# Patient Record
Sex: Female | Born: 1980
Health system: Southern US, Community
[De-identification: ages and names within clinical notes are randomized; demographics above are authoritative.]

## PROBLEM LIST (undated history)

## (undated) DIAGNOSIS — F419 Anxiety disorder, unspecified: Secondary | ICD-10-CM

## (undated) DIAGNOSIS — E063 Autoimmune thyroiditis: Secondary | ICD-10-CM

## (undated) DIAGNOSIS — F319 Bipolar disorder, unspecified: Secondary | ICD-10-CM

## (undated) DIAGNOSIS — K760 Fatty (change of) liver, not elsewhere classified: Secondary | ICD-10-CM

## (undated) DIAGNOSIS — F32A Depression, unspecified: Secondary | ICD-10-CM

## (undated) DIAGNOSIS — E739 Lactose intolerance, unspecified: Secondary | ICD-10-CM

## (undated) DIAGNOSIS — E538 Deficiency of other specified B group vitamins: Secondary | ICD-10-CM

## (undated) DIAGNOSIS — E282 Polycystic ovarian syndrome: Secondary | ICD-10-CM

## (undated) DIAGNOSIS — E663 Overweight: Secondary | ICD-10-CM

## (undated) DIAGNOSIS — F329 Major depressive disorder, single episode, unspecified: Secondary | ICD-10-CM

## (undated) DIAGNOSIS — J45909 Unspecified asthma, uncomplicated: Secondary | ICD-10-CM

## (undated) DIAGNOSIS — M549 Dorsalgia, unspecified: Secondary | ICD-10-CM

## (undated) DIAGNOSIS — K5900 Constipation, unspecified: Secondary | ICD-10-CM

## (undated) HISTORY — DX: Polycystic ovarian syndrome: E28.2

## (undated) HISTORY — DX: Overweight: E66.3

## (undated) HISTORY — DX: Bipolar disorder, unspecified: F31.9

## (undated) HISTORY — DX: Major depressive disorder, single episode, unspecified: F32.9

## (undated) HISTORY — DX: Unspecified asthma, uncomplicated: J45.909

## (undated) HISTORY — DX: Constipation, unspecified: K59.00

## (undated) HISTORY — DX: Dorsalgia, unspecified: M54.9

## (undated) HISTORY — DX: Fatty (change of) liver, not elsewhere classified: K76.0

## (undated) HISTORY — DX: Lactose intolerance, unspecified: E73.9

## (undated) HISTORY — DX: Autoimmune thyroiditis: E06.3

## (undated) HISTORY — DX: Anxiety disorder, unspecified: F41.9

## (undated) HISTORY — DX: Depression, unspecified: F32.A

## (undated) HISTORY — PX: WISDOM TOOTH EXTRACTION: SHX21

## (undated) HISTORY — DX: Deficiency of other specified B group vitamins: E53.8

---

## 2001-11-02 ENCOUNTER — Ambulatory Visit (HOSPITAL_COMMUNITY): Admission: RE | Admit: 2001-11-02 | Discharge: 2001-11-02 | Payer: Self-pay | Admitting: Obstetrics and Gynecology

## 2001-11-02 ENCOUNTER — Encounter: Payer: Self-pay | Admitting: Obstetrics and Gynecology

## 2002-01-05 ENCOUNTER — Ambulatory Visit (HOSPITAL_COMMUNITY): Admission: AD | Admit: 2002-01-05 | Discharge: 2002-01-05 | Payer: Self-pay | Admitting: Obstetrics and Gynecology

## 2002-01-06 ENCOUNTER — Inpatient Hospital Stay (HOSPITAL_COMMUNITY): Admission: AD | Admit: 2002-01-06 | Discharge: 2002-01-06 | Payer: Self-pay | Admitting: Obstetrics and Gynecology

## 2009-03-09 ENCOUNTER — Encounter (INDEPENDENT_AMBULATORY_CARE_PROVIDER_SITE_OTHER): Payer: Self-pay | Admitting: *Deleted

## 2009-03-23 ENCOUNTER — Other Ambulatory Visit: Admission: RE | Admit: 2009-03-23 | Discharge: 2009-03-23 | Payer: Self-pay | Admitting: Family Medicine

## 2009-03-23 ENCOUNTER — Ambulatory Visit: Payer: Self-pay | Admitting: Family Medicine

## 2009-03-23 DIAGNOSIS — D518 Other vitamin B12 deficiency anemias: Secondary | ICD-10-CM | POA: Insufficient documentation

## 2009-03-23 DIAGNOSIS — J45909 Unspecified asthma, uncomplicated: Secondary | ICD-10-CM | POA: Insufficient documentation

## 2009-03-23 DIAGNOSIS — E781 Pure hyperglyceridemia: Secondary | ICD-10-CM | POA: Insufficient documentation

## 2009-03-23 LAB — CONVERTED CEMR LAB
Bilirubin Urine: NEGATIVE
Blood in Urine, dipstick: NEGATIVE
Glucose, Urine, Semiquant: NEGATIVE
Ketones, urine, test strip: NEGATIVE
Nitrite: NEGATIVE
Protein, U semiquant: NEGATIVE
Specific Gravity, Urine: 1.02
Urobilinogen, UA: NEGATIVE
WBC Urine, dipstick: NEGATIVE
pH: 6

## 2009-03-29 ENCOUNTER — Telehealth: Payer: Self-pay | Admitting: Family Medicine

## 2009-03-29 ENCOUNTER — Ambulatory Visit: Payer: Self-pay | Admitting: Family Medicine

## 2009-03-29 DIAGNOSIS — R7402 Elevation of levels of lactic acid dehydrogenase (LDH): Secondary | ICD-10-CM | POA: Insufficient documentation

## 2009-03-29 DIAGNOSIS — R7401 Elevation of levels of liver transaminase levels: Secondary | ICD-10-CM | POA: Insufficient documentation

## 2009-03-29 DIAGNOSIS — D7289 Other specified disorders of white blood cells: Secondary | ICD-10-CM | POA: Insufficient documentation

## 2009-03-29 DIAGNOSIS — R740 Nonspecific elevation of levels of transaminase and lactic acid dehydrogenase [LDH]: Secondary | ICD-10-CM

## 2009-03-29 LAB — CONVERTED CEMR LAB
ALT: 23 units/L (ref 0–35)
AST: 25 units/L (ref 0–37)
Albumin: 4.2 g/dL (ref 3.5–5.2)
Alkaline Phosphatase: 138 units/L — ABNORMAL HIGH (ref 39–117)
BUN: 10 mg/dL (ref 6–23)
Basophils Absolute: 0.1 10*3/uL (ref 0.0–0.1)
Basophils Relative: 2.2 % (ref 0.0–3.0)
Bilirubin, Direct: 0 mg/dL (ref 0.0–0.3)
CO2: 31 meq/L (ref 19–32)
Calcium: 9.3 mg/dL (ref 8.4–10.5)
Chloride: 103 meq/L (ref 96–112)
Cholesterol: 212 mg/dL — ABNORMAL HIGH (ref 0–200)
Creatinine, Ser: 0.9 mg/dL (ref 0.4–1.2)
Direct LDL: 145.7 mg/dL
Eosinophils Absolute: 0.3 10*3/uL (ref 0.0–0.7)
Eosinophils Relative: 5.9 % — ABNORMAL HIGH (ref 0.0–5.0)
Folate: >20 ng/mL
GFR calc non Af Amer: 77.98 mL/min (ref >60)
Glucose, Bld: 73 mg/dL (ref 70–99)
HCT: 41.4 % (ref 36.0–46.0)
HDL: 45.3 mg/dL (ref >39.00)
Hemoglobin: 14.3 g/dL (ref 12.0–15.0)
Hgb A1c MFr Bld: 5.4 % (ref 4.6–6.5)
Lymphocytes Relative: 56.8 % — ABNORMAL HIGH (ref 12.0–46.0)
Lymphs Abs: 3.1 10*3/uL (ref 0.7–4.0)
MCHC: 34.5 g/dL (ref 30.0–36.0)
MCV: 91.1 fL (ref 78.0–100.0)
Monocytes Absolute: 0.9 10*3/uL (ref 0.1–1.0)
Monocytes Relative: 17.5 % — ABNORMAL HIGH (ref 3.0–12.0)
Neutro Abs: 1 10*3/uL — ABNORMAL LOW (ref 1.4–7.7)
Neutrophils Relative %: 17.6 % — ABNORMAL LOW (ref 43.0–77.0)
Platelets: 334 10*3/uL (ref 150.0–400.0)
Potassium: 4.2 meq/L (ref 3.5–5.1)
RBC: 4.54 M/uL (ref 3.87–5.11)
RDW: 12.2 % (ref 11.5–14.6)
Sodium: 141 meq/L (ref 135–145)
TSH: 2.61 microintl units/mL (ref 0.35–5.50)
Total Bilirubin: 0.5 mg/dL (ref 0.3–1.2)
Total CHOL/HDL Ratio: 5
Total Protein: 7.6 g/dL (ref 6.0–8.3)
Triglycerides: 166 mg/dL — ABNORMAL HIGH (ref 0.0–149.0)
VLDL: 33.2 mg/dL (ref 0.0–40.0)
Vitamin B-12: 926 pg/mL — ABNORMAL HIGH (ref 211–911)
WBC: 5.4 10*3/uL (ref 4.5–10.5)

## 2009-04-04 ENCOUNTER — Encounter (INDEPENDENT_AMBULATORY_CARE_PROVIDER_SITE_OTHER): Payer: Self-pay | Admitting: *Deleted

## 2009-04-06 ENCOUNTER — Telehealth (INDEPENDENT_AMBULATORY_CARE_PROVIDER_SITE_OTHER): Payer: Self-pay | Admitting: *Deleted

## 2009-04-06 LAB — CONVERTED CEMR LAB
Basophils Absolute: 0.1 10*3/uL (ref 0.0–0.1)
Basophils Relative: 1 % (ref 0–1)
Eosinophils Absolute: 0.5 10*3/uL (ref 0.0–0.7)
Eosinophils Relative: 4 % (ref 0–5)
HCT: 39.6 % (ref 36.0–46.0)
Hemoglobin: 13.3 g/dL (ref 12.0–15.0)
Lymphocytes Relative: 34 % (ref 12–46)
Lymphs Abs: 4.3 10*3/uL — ABNORMAL HIGH (ref 0.7–4.0)
MCHC: 33.6 g/dL (ref 30.0–36.0)
MCV: 87.4 fL (ref 78.0–100.0)
Monocytes Absolute: 1.2 10*3/uL — ABNORMAL HIGH (ref 0.1–1.0)
Monocytes Relative: 10 % (ref 3–12)
Neutro Abs: 6.5 10*3/uL (ref 1.7–7.7)
Neutrophils Relative %: 52 % (ref 43–77)
Platelets: 368 10*3/uL (ref 150–400)
RBC: 4.53 M/uL (ref 3.87–5.11)
RDW: 12.8 % (ref 11.5–15.5)
WBC: 12.5 10*3/uL — ABNORMAL HIGH (ref 4.0–10.5)

## 2009-04-07 ENCOUNTER — Encounter: Admission: RE | Admit: 2009-04-07 | Discharge: 2009-04-07 | Payer: Self-pay | Admitting: Family Medicine

## 2009-04-10 ENCOUNTER — Encounter (INDEPENDENT_AMBULATORY_CARE_PROVIDER_SITE_OTHER): Payer: Self-pay | Admitting: *Deleted

## 2009-05-22 ENCOUNTER — Encounter: Payer: Self-pay | Admitting: Family Medicine

## 2009-07-11 ENCOUNTER — Ambulatory Visit: Payer: Self-pay | Admitting: Family Medicine

## 2009-07-11 DIAGNOSIS — J069 Acute upper respiratory infection, unspecified: Secondary | ICD-10-CM | POA: Insufficient documentation

## 2009-07-13 ENCOUNTER — Telehealth: Payer: Self-pay | Admitting: Family Medicine

## 2010-03-03 ENCOUNTER — Ambulatory Visit: Payer: Self-pay | Admitting: Internal Medicine

## 2010-06-05 NOTE — Consult Note (Signed)
Summary: [facility]   Imported By: Lanelle Bal 06/16/2009 08:26:02  _____________________________________________________________________  External Attachment:    Type:   Image     Comment:   External Document

## 2010-06-05 NOTE — Progress Notes (Signed)
Summary: Phone- not feeling well  Phone Note Call from Patient Call back at [phone]   Caller: Patient Summary of Call: Patient states she is no better. Patient is coughing,exhaustion,runny nose. Patient states Dr. Laury Axon will prescribe antibodic. [facility] [address] Initial call taken by: Barb Merino,  July 13, 2009 3:42 PM  Follow-up for Phone Call        augmentin 875 mg 1 by mouth two times a day for 10 days  Follow-up by: Loreen Freud DO,  July 13, 2009 4:04 PM  Additional Follow-up for Phone Call Additional follow up Details #1::        left message for pt to call back. Army Fossa CMA  July 13, 2009 4:06 PM     Additional Follow-up for Phone Call Additional follow up Details #2::    Pt is aware- rx sent in. Army Fossa CMA  July 14, 2009 9:57 AM   New/Updated Medications: AUGMENTIN 875-125 MG TABS (AMOXICILLIN-POT CLAVULANATE) 1 by mouth two times a day for 10 days. Prescriptions: AUGMENTIN 875-125 MG TABS (AMOXICILLIN-POT CLAVULANATE) 1 by mouth two times a day for 10 days.  #20 x 0   Entered by:   Barb Merino   Authorized by:   Loreen Freud DO   Signed by:   Barb Merino on 07/13/2009   Method used:   Faxed to ...       [facility] (retail)       [address]       [place], [place]  [address]       Ph: [phone]       Fax: [phone]   RxID:   [id number]

## 2010-06-05 NOTE — Assessment & Plan Note (Signed)
Summary: strep?//lch   Vital Signs:  Patient profile:   30 year old female Weight:      233 pounds Temp:     98.3 degrees F oral Pulse rate:   78 / minute Pulse rhythm:   regular BP sitting:   116 / 84  (left arm) Cuff size:   regular  Vitals Entered By: Army Fossa CMA (July 11, 2009 1:29 PM) CC: Pt c/o coughing, noticied green mucus today. , Cough   History of Present Illness:  Cough      This is a 30 year old woman who presents with Cough.  The symptoms began 2 days ago.  Pt here c/o NV over weekend and was better Sunday and then cough started yesterday.   .  The patient reports productive cough, but denies non-productive cough, pleuritic chest pain, shortness of breath, wheezing, exertional dyspnea, fever, hemoptysis, and malaise.  Associated symtpoms include cold/URI symptoms and nasal congestion.  The cough is worse with activity.    Current Medications (verified): 1)  Pristiq 50 Mg Xr24h-Tab (Desvenlafaxine Succinate) .Marland Kitchen.. 1 By Mouth Daily 2)  B-12 [phone] Mcg Subl (Cobalamine Combinations) .Marland Kitchen.. 1 By Mouth Once Daily. 3)  Vitamin D 09811 .Marland Kitchen.. 1 By Mouth Daily. 4)  Nasonex 50 Mcg/act Susp (Mometasone Furoate) .... 2 Sprays Each Nostril Once Daily  Allergies (verified): No Known Drug Allergies  Past History:  Past medical, surgical, family and social histories (including risk factors) reviewed for relevance to current acute and chronic problems.  Past Medical History: Reviewed history from 03/23/2009 and no changes required. Asthma Bipolar   Past Surgical History: Reviewed history from 03/23/2009 and no changes required. Denies surgical history  Family History: Reviewed history from 03/23/2009 and no changes required. Family History of Alcoholism/Addiction Family History Diabetes 1st degree relative Family History Hypertension Family History of Stroke M 1st degree relative <50 Hypothyroid Bipolar  Parkinson's Disease   Social History: Reviewed history  from 03/23/2009 and no changes required. Occupation: Single Never Smoked Alcohol use-yes Drug use-no  Review of Systems      See HPI  Physical Exam  General:  Well-developed,well-nourished,in no acute distress; alert,appropriate and cooperative throughout examination Ears:  External ear exam shows no significant lesions or deformities.  Otoscopic examination reveals clear canals, tympanic membranes are intact bilaterally without bulging, retraction, inflammation or discharge. Hearing is grossly normal bilaterally. Nose:  External nasal examination shows no deformity or inflammation. Nasal mucosa are pink and moist without lesions or exudates. Mouth:  Oral mucosa and oropharynx without lesions or exudates.  Teeth in good repair. Neck:  No deformities, masses, or tenderness noted. Lungs:  Normal respiratory effort, chest expands symmetrically. Lungs are clear to auscultation, no crackles or wheezes. Heart:  normal rate and no murmur.   Skin:  Intact without suspicious lesions or rashes Cervical Nodes:  No lymphadenopathy noted Psych:  Cognition and judgment appear intact. Alert and cooperative with normal attention span and concentration. No apparent delusions, illusions, hallucinations   Impression & Recommendations:  Problem # 1:  URI (ICD-465.9) mucinex nasonex Instructed on symptomatic treatment. Call if symptoms persist or worsen.   Complete Medication List: 1)  Pristiq 50 Mg Xr24h-tab (Desvenlafaxine succinate) .Marland Kitchen.. 1 by mouth daily 2)  B-12 [phone] Mcg Subl (Cobalamine combinations) .Marland Kitchen.. 1 by mouth once daily. 3)  Vitamin D 91478  .Marland KitchenMarland Kitchen. 1 by mouth daily. 4)  Nasonex 50 Mcg/act Susp (Mometasone furoate) .... 2 sprays each nostril once daily

## 2011-02-13 ENCOUNTER — Encounter: Payer: Self-pay | Admitting: Family Medicine

## 2011-02-13 ENCOUNTER — Ambulatory Visit (INDEPENDENT_AMBULATORY_CARE_PROVIDER_SITE_OTHER): Payer: BC Managed Care – PPO | Admitting: Family Medicine

## 2011-02-13 VITALS — BP 118/86 | HR 80 | Temp 98.7°F | Wt 243.0 lb

## 2011-02-13 DIAGNOSIS — J40 Bronchitis, not specified as acute or chronic: Secondary | ICD-10-CM

## 2011-02-13 MED ORDER — ALBUTEROL SULFATE HFA 108 (90 BASE) MCG/ACT IN AERS: 2.0000 | INHALATION_SPRAY | Freq: Four times a day (QID) | RESPIRATORY_TRACT | Status: DC | PRN

## 2011-02-13 MED ORDER — AZITHROMYCIN 250 MG PO TABS: ORAL_TABLET | ORAL | Status: AC

## 2011-02-13 MED ORDER — ALBUTEROL SULFATE (5 MG/ML) 0.5% IN NEBU
2.5000 mg | INHALATION_SOLUTION | Freq: Once | RESPIRATORY_TRACT | Status: AC
  Administered 2011-02-13: 2.5 mg via RESPIRATORY_TRACT

## 2011-02-13 NOTE — Patient Instructions (Signed)
Bronchitis Bronchitis is the body's way of reacting to injury and/or infection (inflammation) of the bronchi. Bronchi are the air tubes that extend from the windpipe into the lungs. If the inflammation becomes severe, it may cause shortness of breath.  CAUSES Inflammation may be caused by:  A virus.   Germs (bacteria).   Dust.   Allergens.   Pollutants and many other irritants.  The cells lining the bronchial tree are covered with tiny hairs (cilia). These constantly beat upward, away from the lungs, toward the mouth. This keeps the lungs free of pollutants. When these cells become too irritated and are unable to do their job, mucus begins to develop. This causes the characteristic cough of bronchitis. The cough clears the lungs when the cilia are unable to do their job. Without either of these protective mechanisms, the mucus would settle in the lungs. Then you would develop pneumonia. Smoking is a common cause of bronchitis and can contribute to pneumonia. Stopping this habit is the single most important thing you can do to help yourself. TREATMENT  Your caregiver may prescribe an antibiotic if the cough is caused by bacteria. Also, medicines that open up your airways make it easier to breathe. Your caregiver may also recommend or prescribe an expectorant. It will loosen the mucus to be coughed up. Only take over-the-counter or prescription medicines for pain, discomfort, or fever as directed by your caregiver.   Removing whatever causes the problem (smoking, for example) is critical to preventing the problem from getting worse.   Cough suppressants may be prescribed for relief of cough symptoms.   Inhaled medicines may be prescribed to help with symptoms now and to help prevent problems from returning.   For those with recurrent (chronic) bronchitis, there may be a need for steroid medicines.  SEEK IMMEDIATE MEDICAL CARE IF:  During treatment, you develop more pus-like mucus  (purulent sputum).   You or your child has an oral temperature above 100.4, not controlled by medicine.   Your baby is older than 3 months with a rectal temperature of 102 F (38.9 C) or higher.   Your baby is 3 months old or younger with a rectal temperature of 100.4 F (38 C) or higher.   You become progressively more ill.   You have increased difficulty breathing, wheezing, or shortness of breath.  It is necessary to seek immediate medical care if you are elderly or sick from any other disease. MAKE SURE YOU:  Understand these instructions.   Will watch your condition.   Will get help right away if you are not doing well or get worse.  Document Released: 04/22/2005 Document Re-Released: 07/17/2009 [employer] Patient Information 2011 [employer]. 

## 2011-02-13 NOTE — Progress Notes (Signed)
  Subjective:     Mary Knox is a 30 y.o. female here for evaluation of a cough. Onset of symptoms was 2 weeks ago. Symptoms have been gradually worsening since that time. The cough is productive of unknown sputum and is aggravated by stress. Associated symptoms include: shortness of breath, sputum production and wheezing. Patient does have a history of asthma. Patient does not have a history of environmental allergens. Patient has not traveled recently. Patient does not have a history of smoking. Patient has not had a previous chest x-ray. Patient has not had a PPD done.  The following portions of the patient's history were reviewed and updated as appropriate: allergies, current medications, past family history, past medical history, past social history, past surgical history and problem list.  Review of Systems Pertinent items are noted in HPI.    Objective:    Oxygen saturation 98% on room air BP 118/86  Pulse 80  Temp(Src) 98.7 F (37.1 C) (Oral)  Wt 243 lb (110.224 kg)  SpO2 98% General appearance: alert, cooperative, appears stated age and mild distress Ears: normal TM's and external ear canals both ears Nose: Nares normal. Septum midline. Mucosa normal. No drainage or sinus tenderness. Throat: lips, mucosa, and tongue normal; teeth and gums normal Lungs: diminished breath sounds bilaterally and wheezes bilaterally--- cleared with neb Heart: regular rate and rhythm, S1, S2 normal, no murmur, click, rub or gallop Extremities: extremities normal, atraumatic, no cyanosis or edema    Assessment:    Acute Bronchitis    Plan:  Neb given in office with some relief  Antibiotics per medication orders. Avoid exposure to tobacco smoke and fumes. B-agonist inhaler. Call if shortness of breath worsens, blood in sputum, change in character of cough, development of fever or chills, inability to maintain nutrition and hydration. Avoid exposure to tobacco smoke and fumes. Steroid inhaler  as ordered.

## 2011-03-12 ENCOUNTER — Encounter: Payer: Self-pay | Admitting: Internal Medicine

## 2011-03-12 ENCOUNTER — Ambulatory Visit (INDEPENDENT_AMBULATORY_CARE_PROVIDER_SITE_OTHER): Payer: BC Managed Care – PPO | Admitting: Internal Medicine

## 2011-03-12 ENCOUNTER — Ambulatory Visit: Payer: BC Managed Care – PPO | Admitting: Family Medicine

## 2011-03-12 VITALS — BP 120/76 | HR 68 | Temp 98.3°F | Resp 18 | Wt 241.0 lb

## 2011-03-12 DIAGNOSIS — J329 Chronic sinusitis, unspecified: Secondary | ICD-10-CM

## 2011-03-12 DIAGNOSIS — H6690 Otitis media, unspecified, unspecified ear: Secondary | ICD-10-CM

## 2011-03-12 MED ORDER — PREDNISONE 10 MG PO TABS: ORAL_TABLET | ORAL | Status: AC

## 2011-03-12 MED ORDER — MOXIFLOXACIN HCL 400 MG PO TABS: 400.0000 mg | ORAL_TABLET | Freq: Every day | ORAL | Status: AC

## 2011-03-12 NOTE — Progress Notes (Signed)
  Subjective:    Patient ID: Mary Knox, female    DOB: 12/21/1980, 30 y.o.   MRN: [id number]  HPI Patient was seen last month with bronchitis, she took a Z-Pak, used her inhalers and Nasonex until better. Symptoms are gradually coming back. She has moderate sinus congestion and pain, more cough; had  fever one time last week.  Past Medical History  Diagnosis Date  . Asthma   . Bipolar disorder    No past surgical history on file.   Review of Systems She has abundant yellow nasal discharge and sputum. No nausea, vomiting or myalgias. Her asthma is slightly exacerbated, occasionally short of breath. No chest pain. She does have a frontal headache with cough only. Had  sore throat last week, that resolved    Objective:   Physical Exam  Constitutional: She appears well-developed and well-nourished.  HENT:  Head: Normocephalic and atraumatic.       Right tympanic membrane slightly bulging red, left tympanic membrane normal. Nose definitely congested. Throat without redness Face symmetric and nontender to palpation.   Cardiovascular: Normal rate, regular rhythm and normal heart sounds.   No murmur heard. Pulmonary/Chest: Effort normal and breath sounds normal. No respiratory distress. She has no wheezes. She has no rales.          Assessment & Plan:  Sinusitis, otitis media: Most likely has moderate sinusitis and right otitis media. Asthma fortunately is not severely exacerbated. We'll prescribe Avelox (the patient has an IUD). See instructions

## 2011-03-12 NOTE — Patient Instructions (Signed)
Rest, fluids , tylenol For cough, take Mucinex DM twice a day as needed  Prednisone x 5 days  Take the antibiotic as prescribed ----> Avelox  Call if no better in few days Call anytime if the symptoms are severe, you have high fever, shortness of breath

## 2011-03-21 ENCOUNTER — Telehealth: Payer: Self-pay | Admitting: Family Medicine

## 2011-03-21 NOTE — Telephone Encounter (Signed)
Pt needs appt. Left message on machine for pt to return my call.

## 2011-03-25 NOTE — Telephone Encounter (Signed)
msg left to call the office     KP 

## 2011-03-26 NOTE — Telephone Encounter (Signed)
Discussed with patient and apt is scheduled for Friday    KP

## 2011-03-29 ENCOUNTER — Ambulatory Visit (INDEPENDENT_AMBULATORY_CARE_PROVIDER_SITE_OTHER): Payer: BC Managed Care – PPO | Admitting: Internal Medicine

## 2011-03-29 ENCOUNTER — Encounter: Payer: Self-pay | Admitting: Internal Medicine

## 2011-03-29 DIAGNOSIS — J40 Bronchitis, not specified as acute or chronic: Secondary | ICD-10-CM

## 2011-03-29 DIAGNOSIS — J069 Acute upper respiratory infection, unspecified: Secondary | ICD-10-CM

## 2011-03-29 LAB — CBC WITH DIFFERENTIAL/PLATELET
Basophils Relative: 0 % (ref 0–1)
Eosinophils Absolute: 0.6 10*3/uL (ref 0.0–0.7)
Hemoglobin: 14.3 g/dL (ref 12.0–15.0)
Lymphs Abs: 4.4 10*3/uL — ABNORMAL HIGH (ref 0.7–4.0)
MCH: 30.8 pg (ref 26.0–34.0)
Monocytes Relative: 10 % (ref 3–12)
Neutro Abs: 9.4 10*3/uL — ABNORMAL HIGH (ref 1.7–7.7)
Neutrophils Relative %: 59 % (ref 43–77)
Platelets: 412 10*3/uL — ABNORMAL HIGH (ref 150–400)
RBC: 4.65 MIL/uL (ref 3.87–5.11)
WBC: 16 10*3/uL — ABNORMAL HIGH (ref 4.0–10.5)

## 2011-03-29 MED ORDER — HYDROCODONE-HOMATROPINE 5-1.5 MG/5ML PO SYRP
5.0000 mL | ORAL_SOLUTION | Freq: Four times a day (QID) | ORAL | Status: AC | PRN
Start: 2011-03-29 — End: 2011-04-08

## 2011-03-29 MED ORDER — METRONIDAZOLE 500 MG PO TABS: 500.0000 mg | ORAL_TABLET | Freq: Three times a day (TID) | ORAL | Status: AC

## 2011-03-29 NOTE — Progress Notes (Signed)
Addended by: Legrand Como on: 03/29/2011 02:35 PM   Modules accepted: Orders

## 2011-03-29 NOTE — Progress Notes (Signed)
  Subjective:    Patient ID: Mary Knox, female    DOB: 1981-02-19, 30 y.o.   MRN: [id number]  HPI Respiratory tract infection Onset/symptoms:> 2 months ago; initially bronchitis & Zpack Rxed Treatments/response:S/P Zithromax in 10/12 & Avelox X 5 days earlier this month. Chest symptoms with Zpack. Avelox Rxed for sinus/ ear  infection Present symptoms: Fever/chills/sweats:no Frontal headache:no Facial pain:no Nasal purulence:yellow / green Sore throat:11/22 Dental pain:no Lymphadenopathy:no Wheezing/shortness of breath:wheezing slightly Cough/sputum/hemoptysis:scant green, > volume from head  Pleuritic pain:no Past medical history: Seasonal allergies /asthma:EIB only Smoking history:never            Review of Systems     Objective:   Physical Exam General appearance is of good health and nourishment; no acute distress or increased work of breathing is present.  No  lymphadenopathy about the head, neck, or axilla noted.   Eyes: No conjunctival inflammation or lid edema is present.   Ears:  External ear exam shows no significant lesions or deformities.  Otoscopic examination reveals clear canals, tympanic membranes are intact bilaterally without bulging, retraction, inflammation or discharge.  Nose:  External nasal examination shows no deformity or inflammation. Nasal mucosa are pink and moist without lesions or exudates. No septal dislocation .No obstruction to airflow.   Oral exam: Dental hygiene is good; lips and gums are healthy appearing.There is no oropharyngeal erythema or exudate noted.      Heart:  Normal rate and regular rhythm. S1 and S2 normal without gallop, murmur, click, rub or other extra sounds.   Lungs:Chest clear to auscultation; no wheezes, rhonchi,rales ,or rubs present.No increased work of breathing.  Dry cough  Extremities:  No cyanosis, edema, or clubbing  noted    Skin: Warm & but slightly damp          Assessment & Plan:  #1 upper  respiratory tract infection with some purulent secretions, protracted period  #2 bronchitis with a past history of exercise-induced bronchospasm  Plan: See orders and recommendations

## 2011-03-29 NOTE — Patient Instructions (Addendum)
Plain Mucinex for thick secretions ;force NON dairy fluids for next 48 hrs. Use a Neti pot daily as needed for sinus congestion .Please take the probiotic , Align, every day until the bowels are normal. This will replace the normal bacteria which  are necessary for formation of normal stool and processing of food.

## 2011-05-16 ENCOUNTER — Encounter: Payer: Self-pay | Admitting: Family Medicine

## 2011-05-16 ENCOUNTER — Ambulatory Visit (INDEPENDENT_AMBULATORY_CARE_PROVIDER_SITE_OTHER): Payer: BC Managed Care – PPO | Admitting: Family Medicine

## 2011-05-16 VITALS — BP 104/70 | HR 64 | Temp 98.6°F | Wt 244.4 lb

## 2011-05-16 DIAGNOSIS — J329 Chronic sinusitis, unspecified: Secondary | ICD-10-CM

## 2011-05-16 MED ORDER — AZELASTINE-FLUTICASONE 137-50 MCG/ACT NA SUSP: 1.0000 | Freq: Two times a day (BID) | NASAL | Status: DC

## 2011-05-16 NOTE — Progress Notes (Signed)
  Subjective:     Mary Knox is a 31 y.o. female who presents for evaluation of sinus pain. Symptoms include: congestion, cough, nasal congestion and sinus pressure. Onset of symptoms was 5 months ago. Symptoms have been gradually worsening since that time. Past history is significant for asthma. Patient is a non-smoker.  The following portions of the patient's history were reviewed and updated as appropriate: allergies, current medications, past family history, past medical history, past social history, past surgical history and problem list.  Review of Systems Pertinent items are noted in HPI.   Objective:    BP 104/70  Pulse 64  Temp(Src) 98.6 F (37 C) (Oral)  Wt 244 lb 6.4 oz (110.859 kg)  SpO2 97% General appearance: alert, cooperative, appears stated age and no distress Ears: normal TM's and external ear canals both ears Nose: clear discharge, moderate congestion, no sinus tenderness Throat: lips, mucosa, and tongue normal; teeth and gums normal Neck: no adenopathy and thyroid not enlarged, symmetric, no tenderness/mass/nodules Lungs: clear to auscultation bilaterally Heart: regular rate and rhythm, S1, S2 normal, no murmur, click, rub or gallop Extremities: extremities normal, atraumatic, no cyanosis or edema    Assessment:    Acute non-bacterial sinusitis.    Plan:    Nasal saline sprays. Nasal steroids per medication orders. Antihistamines per medication orders. Sinus CT scan ordered. Follow up in a few days or as needed.

## 2011-05-16 NOTE — Patient Instructions (Signed)

## 2011-05-21 ENCOUNTER — Telehealth: Payer: Self-pay | Admitting: *Deleted

## 2011-05-21 ENCOUNTER — Ambulatory Visit (INDEPENDENT_AMBULATORY_CARE_PROVIDER_SITE_OTHER)
Admission: RE | Admit: 2011-05-21 | Discharge: 2011-05-21 | Disposition: A | Discharge status: Home / Self Care | Payer: BC Managed Care – PPO | Source: Ambulatory Visit | Attending: Family Medicine | Admitting: Family Medicine

## 2011-05-21 DIAGNOSIS — J329 Chronic sinusitis, unspecified: Secondary | ICD-10-CM

## 2011-05-21 MED ORDER — PREDNISONE 10 MG PO TABS: ORAL_TABLET | ORAL | Status: DC

## 2011-05-21 MED ORDER — MOXIFLOXACIN HCL 400 MG PO TABS: 400.0000 mg | ORAL_TABLET | Freq: Every day | ORAL | Status: AC

## 2011-05-21 NOTE — Telephone Encounter (Signed)
Spoke to Mary Knox with results that she has sinusitis and for her to start Avelox 1 PO QD for 10 days as well as Prednisone 10mg  take TID x 3 days, then BID x 3days then QD x 3days, Mary Knox understood and pharmacy verifed

## 2012-01-21 ENCOUNTER — Ambulatory Visit (INDEPENDENT_AMBULATORY_CARE_PROVIDER_SITE_OTHER): Payer: BC Managed Care – PPO | Admitting: Family Medicine

## 2012-01-21 ENCOUNTER — Encounter: Payer: Self-pay | Admitting: Family Medicine

## 2012-01-21 VITALS — BP 110/74 | HR 76 | Temp 99.0°F | Wt 258.8 lb

## 2012-01-21 DIAGNOSIS — J329 Chronic sinusitis, unspecified: Secondary | ICD-10-CM

## 2012-01-21 DIAGNOSIS — J40 Bronchitis, not specified as acute or chronic: Secondary | ICD-10-CM

## 2012-01-21 MED ORDER — SULFAMETHOXAZOLE-TRIMETHOPRIM 800-160 MG PO TABS: 1.0000 | ORAL_TABLET | Freq: Two times a day (BID) | ORAL | Status: DC

## 2012-01-21 MED ORDER — ALBUTEROL SULFATE HFA 108 (90 BASE) MCG/ACT IN AERS: 2.0000 | INHALATION_SPRAY | Freq: Four times a day (QID) | RESPIRATORY_TRACT | Status: DC | PRN

## 2012-01-21 NOTE — Patient Instructions (Addendum)

## 2012-01-21 NOTE — Progress Notes (Signed)
  Subjective:     Mary Knox is a 31 y.o. female who presents for evaluation of sinus pain. Symptoms include: congestion, cough, facial pain, nasal congestion, purulent rhinorrhea, sinus pressure and sore throat. Onset of symptoms was 1 week ago. Symptoms have been gradually worsening since that time. Past history is significant for no history of pneumonia or bronchitis.  + hx asthma  Patient is a non-smoker.  The following portions of the patient's history were reviewed and updated as appropriate: allergies, current medications, past family history, past medical history, past social history, past surgical history and problem list.  Review of Systems Pertinent items are noted in HPI.   Objective:    BP 110/74  Pulse 76  Temp 99 F (37.2 C) (Oral)  Wt 258 lb 12.8 oz (117.391 kg)  SpO2 98% General appearance: alert, cooperative, appears stated age and no distress Ears: normal TM's and external ear canals both ears Nose: green discharge, moderate congestion, turbinates red, swollen, sinus tenderness bilateral Throat: lips, mucosa, and tongue normal; teeth and gums normal Neck: no adenopathy, no carotid bruit, no JVD, supple, symmetrical, trachea midline and thyroid not enlarged, symmetric, no tenderness/mass/nodules Lungs: clear to auscultation bilaterally Heart: S1, S2 normal    Assessment:    Acute bacterial  Gastroenteritis, viral. ----  Check stool cultures-- may be vomiting from mucous--diarrhea is slowing down  Plan:    Nasal steroids per medication orders. Antihistamines per medication orders. Bactrim per medication orders.

## 2012-02-07 ENCOUNTER — Telehealth: Payer: Self-pay | Admitting: Family Medicine

## 2012-02-07 NOTE — Telephone Encounter (Signed)
We can see her next week if she needs [place]--- if something changes overnight she can call tomorrow to be seen in sat clinic in am

## 2012-02-07 NOTE — Telephone Encounter (Signed)
Caller: [name]/Patient; Patient Name: Mary Knox; PCP: Lelon Perla.; Best Callback Phone Number: [phone]; Reason for call: She states she was recently placed on medication for "swollen tonsils and sinus infection" . Antibiotics were completed two days (Bactrim) and ProAir inhaler which she has not used.  She noticed this morning 02/07/12 a "mottled appearance to her chest area" . It is has decreased some but still remains near the neck area.  You are not able to feel it with your hand, no itching, no pain or discomfort.  The last medication she took was Mucinex DM , yesterday 02/06/12.  She states decreased appetite and not a lot of fluids today.  Intake today a cup of coffee, cup of water and tea, 1/2 can of soda. Afebrile.  No complaints of discomfort. Emergent s/sx ruled out per Skin Lesions protocol with exception to "any other local skin change". Reviewed home care instructions to closely mointor . Stop OTC products , increase fluids.  Caller expressed understanding . She will call back for quesitons, changes or concerns.

## 2012-02-07 NOTE — Telephone Encounter (Signed)
Discussed with patient and she voiced understanding and agreed to use Sat clinic if needed.      KP

## 2012-02-10 ENCOUNTER — Encounter: Payer: Self-pay | Admitting: Internal Medicine

## 2012-02-10 ENCOUNTER — Ambulatory Visit (INDEPENDENT_AMBULATORY_CARE_PROVIDER_SITE_OTHER): Payer: BC Managed Care – PPO | Admitting: Internal Medicine

## 2012-02-10 VITALS — BP 126/80 | HR 78 | Temp 98.2°F | Wt 258.0 lb

## 2012-02-10 DIAGNOSIS — J069 Acute upper respiratory infection, unspecified: Secondary | ICD-10-CM

## 2012-02-10 MED ORDER — MOMETASONE FUROATE 50 MCG/ACT NA SUSP: NASAL | Status: DC

## 2012-02-10 NOTE — Patient Instructions (Addendum)
Plain Mucinex for thick secretions ;force NON dairy fluids . Use a Neti pot daily as needed for sinus congestion; going from open side to congested side . Nasal cleansing in the shower as discussed. Make sure that all residual soap is removed to prevent irritation. Nasonex 1 spray in each nostril twice a day as needed. Use the "crossover" technique as discussed. Plain Allegra 160 daily as needed for itchy eyes & sneezing.    

## 2012-02-10 NOTE — Progress Notes (Signed)
  Subjective:    Patient ID: Mary Knox, female    DOB: 07/14/1980, 31 y.o.   MRN: [id number]  HPI She is completing 10 days of sulfa he continues to have head congestion. On the last day the sulfa she did notice a rash over the anterior thorax.  She notices some redness in her eyes in the morning and intermittent scant discharge. She denies associated  double vision or loss of vision. Vision was slightly blurred this am  Her major concern is that she had a protracted respiratory tract infection last year which lasted 5 months    Review of Systems She specifically denies frontal headache, facial pain, nasal purulence, ST,otic pain or discharge, fever, chills,or sweats.     Objective:   Physical Exam General appearance:good health ;well nourished; no acute distress or increased work of breathing is present.  No  lymphadenopathy about the head, neck, or axilla noted.   Eyes: No conjunctival inflammation or lid edema is present. There is no scleral icterus. EOM & vision intact  Ears:  External ear exam shows no significant lesions or deformities.  Otoscopic examination reveals clear canals, tympanic membranes are intact bilaterally without bulging, retraction, inflammation or discharge.  Nose:  External nasal examination shows no deformity or inflammation. Nasal mucosa are pink and moist without lesions or exudates. No septal dislocation or deviation.No obstruction to airflow.   Oral exam: Dental hygiene is good; lips and gums are healthy appearing.There is no oropharyngeal erythema or exudate noted.   Neck:  No deformities,  masses, or tenderness noted.   Supple with full range of motion without pain.   Heart:  Normal rate and regular rhythm. S1 and S2 normal without gallop, murmur, click, rub or other extra sounds.   Lungs:Chest clear to auscultation; no wheezes, rhonchi,rales ,or rubs present.No increased work of breathing.    Extremities:  No cyanosis, edema, or clubbing  noted     Skin: Warm & dry           Assessment & Plan:  #1 upper respiratory tract symptoms without criteria for rhinosinusitis. Clinically no evidence of infectious conjunctivitis  Plan: See orders and recommendations

## 2012-03-05 ENCOUNTER — Ambulatory Visit (INDEPENDENT_AMBULATORY_CARE_PROVIDER_SITE_OTHER): Payer: BC Managed Care – PPO | Admitting: Internal Medicine

## 2012-03-05 ENCOUNTER — Encounter: Payer: Self-pay | Admitting: Internal Medicine

## 2012-03-05 VITALS — BP 122/78 | HR 82 | Temp 98.4°F | Wt 253.2 lb

## 2012-03-05 DIAGNOSIS — N390 Urinary tract infection, site not specified: Secondary | ICD-10-CM

## 2012-03-05 LAB — POCT URINALYSIS DIPSTICK
Nitrite, UA: NEGATIVE
Protein, UA: NEGATIVE
Urobilinogen, UA: 0.2
pH, UA: 5

## 2012-03-05 MED ORDER — PHENAZOPYRIDINE HCL 200 MG PO TABS: 200.0000 mg | ORAL_TABLET | Freq: Three times a day (TID) | ORAL | Status: DC | PRN

## 2012-03-05 MED ORDER — NITROFURANTOIN MONOHYD MACRO 100 MG PO CAPS: 100.0000 mg | ORAL_CAPSULE | Freq: Two times a day (BID) | ORAL | Status: DC

## 2012-03-05 NOTE — Patient Instructions (Addendum)
Drink as much nondairy fluids as possible. Avoid spicy foods ; alcohol ; decongestants; & narcotics if possible.   If you activate My Chart; the results can be released to you as soon as they populate from the lab. If you choose not to use this program; the labs have to be reviewed, copied & mailed   causing a delay in getting the results to you.

## 2012-03-05 NOTE — Progress Notes (Signed)
  Subjective:    Patient ID: Mary Knox, female    DOB: [birth year], 31 y.o.   MRN: [id number]  HPI  Symptoms began 03/03/12 as mild dysuria. She noted a urine to be darker and somewhat orange-brown and cloudy. She had no associated hematuria or pyuria. She also has had no flank pain.  She does not have a history of recurrent urinary tract infections    Review of Systems Over the weekend she had a respiratory tract infection with some possible low-grade fever and malaise.     Objective:   Physical Exam General appearance is one of good health and nourishment w/o distress.  Eyes: No conjunctival inflammation or scleral icterus is present.  Oral exam: Dental hygiene is good; lips and gums are healthy appearing.There is no oropharyngeal erythema or exudate noted.   Heart:  Normal rate and regular rhythm. S1 and S2 normal without gallop, murmur, click, rub or other extra sounds     Lungs:Chest clear to auscultation; no wheezes, rhonchi,rales ,or rubs present.No increased work of breathing.   Abdomen: bowel sounds normal, soft and non-tender without masses, organomegaly or hernias noted.  No guarding or rebound . Tenderness to percussion   Skin:Warm & dry.  Intact without suspicious lesions or rashes ; no jaundice or tenting  Lymphatic: No lymphadenopathy is noted about the head, neck, axilla  Musculoskeletal: She is able to lie flat and sit up without help. Negative straight leg raising bilaterally             Assessment & Plan:  #1 urinary tract infection, culture pending  Plan: See orders and recommendations

## 2012-03-07 LAB — URINE CULTURE

## 2012-03-10 ENCOUNTER — Telehealth: Payer: Self-pay

## 2012-03-10 NOTE — Telephone Encounter (Signed)
Left message on voicemail for patient to return call when available   

## 2012-03-10 NOTE — Telephone Encounter (Signed)
Spoke with patient, patient verbalized understanding of information and indicated she never began to take med, she held off to see what results show

## 2012-03-10 NOTE — Telephone Encounter (Signed)
Message copied by Maurice Small on Tue Mar 10, 2012  8:30 AM ------      Message from: Pecola Lawless      Created: Sat Mar 07, 2012  6:46 PM       Significant urinary tract infection is over hundred thousand colonies of one bacteria,not multiple bacteria species. Please stop the antibiotic after  three days. Reculture the urine if symptoms recur. Drink up to 32 ounces of fluids daily to flush the kidneys and bladder.

## 2012-05-11 ENCOUNTER — Telehealth: Payer: Self-pay | Admitting: Family Medicine

## 2012-05-11 ENCOUNTER — Ambulatory Visit: Payer: BC Managed Care – PPO | Admitting: Family Medicine

## 2012-05-11 NOTE — Telephone Encounter (Signed)
noted 

## 2012-05-11 NOTE — Telephone Encounter (Signed)
Patient Information:  Caller Name: Imogen  Phone: [phone]  Patient: Mary Knox, Debruler  Gender: Female  DOB: 1980-07-07  Age: 32 Years  PCP: Marga Melnick  Pregnant: No  Office Follow Up:  Does the office need to follow up with this patient?: No  Instructions For The Office: N/A  RN Note: Referenced Upper Respiratory Infection guideline, disposition: see provider within 24 hours for "Productive cough with colored sputum."   Patient already has an appointment scheduled with Dr. Laury Axon this afternoon. Advised her to keep the appointment. Care advice and call back parameters given per guideline. Patient verbalized understanding.  Symptoms  Reason For Call & Symptoms: Reports productive cough with yellow mucus, body aches, fatigue. Reports she took today off work because she feels so bad.  Reviewed Health History In EMR: Yes  Reviewed Medications In EMR: Yes  Reviewed Allergies In EMR: Yes  Reviewed Surgeries / Procedures: Yes  Date of Onset of Symptoms: 05/04/2012 OB / GYN:  LMP: 05/11/2012  Guideline(s) Used:  No Protocol Available - Sick Adult  Disposition Per Guideline:   See Today in Office  Reason For Disposition Reached:   Nursing judgment  Advice Given:  Call Back If:  You become worse.

## 2012-05-12 ENCOUNTER — Encounter: Payer: Self-pay | Admitting: Internal Medicine

## 2012-05-12 ENCOUNTER — Ambulatory Visit (INDEPENDENT_AMBULATORY_CARE_PROVIDER_SITE_OTHER): Payer: BC Managed Care – PPO | Admitting: Internal Medicine

## 2012-05-12 VITALS — BP 122/80 | HR 89 | Temp 98.0°F | Wt 251.6 lb

## 2012-05-12 DIAGNOSIS — J019 Acute sinusitis, unspecified: Secondary | ICD-10-CM

## 2012-05-12 MED ORDER — CEFUROXIME AXETIL 500 MG PO TABS: 500.0000 mg | ORAL_TABLET | Freq: Two times a day (BID) | ORAL | Status: DC

## 2012-05-12 MED ORDER — MOMETASONE FUROATE 50 MCG/ACT NA SUSP: 2.0000 | Freq: Every day | NASAL | Status: DC

## 2012-05-12 NOTE — Patient Instructions (Addendum)
Plain Mucinex for thick secretions ;force NON dairy fluids . Use a Neti pot daily as needed for sinus congestion; going from open side to congested side . Nasal cleansing in the shower as discussed. Make sure that all residual soap is removed to prevent irritation. Nasonex 1 spray in each nostril twice a day as needed. Use the "crossover" technique as discussed. Plain Allegra 160 daily as needed for itchy eyes & sneezing. Please take the probiotic , Align, every day while on antibiotics. This will replace the normal bacteria which  are necessary for formation of normal stool and processing of food.

## 2012-05-12 NOTE — Progress Notes (Signed)
  Subjective:    Patient ID: Mary Knox, female    DOB: [birth year], 32 y.o.   MRN: [id number]  HPI The respiratory tract symptoms began  8 days ago as malaise followed by sore throat & rhinitis.  Cough with  yellow plugs 05/08/12 .  Significant associated symptoms included frontal headache, facial pain,  nasal purulence, ear pressure w/o discharge  No fever but chills and sweats 2-3 days ago    No treatment to date  There is  history of EIB w/o  asthma. The patient had never smoked                    Review of Systems Cough was not associated with shortness of breath or wheezing   Itchy , watery eyes & sneezing were not noted.    Different smelling vaginal discharge pre menses last week     Objective:   Physical Exam General appearance:good health ;well nourished; no acute distress or increased work of breathing is present.  No  lymphadenopathy about the head, neck, or axilla noted.  Eyes: No conjunctival inflammation or lid edema is present. . Ears:  External ear exam shows no significant lesions or deformities.  Otoscopic examination reveals clear canals, tympanic membranes are intact bilaterally without bulging, retraction, inflammation or discharge. Nose:  External nasal examination shows no deformity or inflammation. Nasal mucosa are slightly boggy without lesions or exudates. No septal dislocation or deviation.No obstruction to airflow.  Oral exam: Dental hygiene is good; lips and gums are healthy appearing.There is no oropharyngeal erythema or exudate noted.  Neck:  No deformities,  masses, or tenderness noted.    Heart:  Normal rate and regular rhythm. S1 and S2 normal without gallop, murmur, click, rub or other extra sounds.  Lungs:Chest clear to auscultation; no wheezes, rhonchi,rales ,or rubs present.No increased work of breathing.   Extremities:  No cyanosis, edema, or clubbing  noted  Skin: Warm & dry         Assessment & Plan:  #1 rhinosinusitis  without significant bronchitis. 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 See prescription medications

## 2012-11-04 ENCOUNTER — Ambulatory Visit: Payer: BC Managed Care – PPO | Admitting: Internal Medicine

## 2012-11-09 ENCOUNTER — Ambulatory Visit: Payer: BC Managed Care – PPO | Admitting: Family Medicine

## 2012-11-12 ENCOUNTER — Ambulatory Visit (INDEPENDENT_AMBULATORY_CARE_PROVIDER_SITE_OTHER): Payer: BC Managed Care – PPO | Admitting: Internal Medicine

## 2012-11-12 ENCOUNTER — Encounter: Payer: Self-pay | Admitting: Internal Medicine

## 2012-11-12 VITALS — BP 118/76 | HR 80 | Temp 98.6°F | Wt 252.0 lb

## 2012-11-12 DIAGNOSIS — R109 Unspecified abdominal pain: Secondary | ICD-10-CM

## 2012-11-12 DIAGNOSIS — E039 Hypothyroidism, unspecified: Secondary | ICD-10-CM

## 2012-11-12 DIAGNOSIS — R7401 Elevation of levels of liver transaminase levels: Secondary | ICD-10-CM

## 2012-11-12 DIAGNOSIS — E282 Polycystic ovarian syndrome: Secondary | ICD-10-CM | POA: Insufficient documentation

## 2012-11-12 DIAGNOSIS — R7402 Elevation of levels of lactic acid dehydrogenase (LDH): Secondary | ICD-10-CM

## 2012-11-12 DIAGNOSIS — F319 Bipolar disorder, unspecified: Secondary | ICD-10-CM

## 2012-11-12 DIAGNOSIS — Z931 Gastrostomy status: Secondary | ICD-10-CM | POA: Insufficient documentation

## 2012-11-12 DIAGNOSIS — R82998 Other abnormal findings in urine: Secondary | ICD-10-CM

## 2012-11-12 DIAGNOSIS — R7309 Other abnormal glucose: Secondary | ICD-10-CM

## 2012-11-12 DIAGNOSIS — E8881 Metabolic syndrome: Secondary | ICD-10-CM

## 2012-11-12 LAB — POCT URINALYSIS DIPSTICK
Glucose, UA: NEGATIVE
Spec Grav, UA: 1.02
Urobilinogen, UA: 0.2

## 2012-11-12 NOTE — Patient Instructions (Addendum)

## 2012-11-12 NOTE — Progress Notes (Signed)
  Subjective:    Patient ID: Mary Knox, female    DOB: Aug 24, 1980, 32 y.o.   MRN: [id number]  HPI  She presents with intermittent sharp bilateral lower quadrant abdominal pain, right greater than left for at least one month. She's also had dark urine for the same period. Course is progressive with darker urine & increased severity of pain , up to level 3,lasting minutes . It was not impacted by  bowel function or urination . Exacerbating factors include  RDP. No treatment to date.   Past medical history and family history are  positive  for polycystic ovaries. PMH of  " high A1c " ; elevated LFTs & untreated hypothyroidism No PMH renal Disease/Calculi;Urinary Tract Abnormality; Instrumentation/Trauma or Immunosuppression. Specifically no steroids for asthma     Review of Systems Nausea vomiting, constipation , diarrhea, melena or rectal bleeding were not described except with anal fissure flare.  No clay colored stool.There was no associated dyspepsia ,dysphagia, anorexia or hematemesis. No significant weight change noted. Fever,chills,  sweats, and significant weight loss were not present. Dysuria, pyuria, and hematuria were absent. There was no associated rash or radicular pain in the area of the discomfort.      Objective:   Physical Exam General appearance is one of adequate nourishment w/o distress.Weight excess  Eyes: No conjunctival inflammation or scleral icterus is present.  Oral exam: Dental hygiene is good; lips and gums are healthy appearing.There is no oropharyngeal erythema or exudate noted.   Heart:  Normal rate and regular rhythm. S1 and S2 normal without gallop, murmur, click, rub or other extra sounds     Lungs:Chest clear to auscultation; no wheezes, rhonchi,rales ,or rubs present.No increased work of breathing.   Abdomen: bowel sounds normal, soft and non-tender without masses, organomegaly or hernias noted.  No guarding or rebound   Skin:Warm & dry.   Intact without suspicious lesions or rashes ; no jaundice or tenting  Lymphatic: No lymphadenopathy is noted about the head, neck, axillary areas.  Striae of abdomen. Hirsutism            Assessment & Plan:  #1 abdominal pain #2 Current Assessment & Plan in Problem List under specific Diagnosis

## 2012-11-13 LAB — CBC WITH DIFFERENTIAL/PLATELET
Basophils Absolute: 0 10*3/uL (ref 0.0–0.1)
Eosinophils Absolute: 0.8 10*3/uL — ABNORMAL HIGH (ref 0.0–0.7)
HCT: 43.4 % (ref 36.0–46.0)
Hemoglobin: 14.6 g/dL (ref 12.0–15.0)
Lymphs Abs: 2.9 10*3/uL (ref 0.7–4.0)
MCHC: 33.8 g/dL (ref 30.0–36.0)
MCV: 93.4 fl (ref 78.0–100.0)
Monocytes Absolute: 0.8 10*3/uL (ref 0.1–1.0)
Neutro Abs: 8.4 10*3/uL — ABNORMAL HIGH (ref 1.4–7.7)
RDW: 13.1 % (ref 11.5–14.6)

## 2012-11-13 LAB — HEPATIC FUNCTION PANEL
AST: 25 U/L (ref 0–37)
Albumin: 4.1 g/dL (ref 3.5–5.2)
Alkaline Phosphatase: 87 U/L (ref 39–117)

## 2012-11-13 LAB — BASIC METABOLIC PANEL
GFR: 69.89 mL/min (ref >60.00)
Glucose, Bld: 119 mg/dL — ABNORMAL HIGH (ref 70–99)
Potassium: 3.6 mEq/L (ref 3.5–5.1)
Sodium: 138 mEq/L (ref 135–145)

## 2013-01-13 ENCOUNTER — Ambulatory Visit (INDEPENDENT_AMBULATORY_CARE_PROVIDER_SITE_OTHER): Payer: BC Managed Care – PPO | Admitting: Family Medicine

## 2013-01-13 ENCOUNTER — Encounter: Payer: Self-pay | Admitting: Family Medicine

## 2013-01-13 VITALS — BP 120/78 | HR 69 | Temp 98.3°F | Ht 67.0 in | Wt 252.8 lb

## 2013-01-13 DIAGNOSIS — B079 Viral wart, unspecified: Secondary | ICD-10-CM

## 2013-01-13 NOTE — Progress Notes (Signed)
  Shave Biopsy Procedure Note  Pre-operative Diagnosis: Suspicious lesion  Post-operative Diagnosis: wart  Locations:anterior lower leg  Indications: new and changing  Anesthesia: Lidocaine 1% with epinephrine without added sodium bicarbonate  Procedure Details  History of allergy to iodine: no  Patient informed of the risks (including bleeding and infection) and benefits of the  procedure and Written informed consent obtained.  The lesion and surrounding area were given a sterile prep using betadyne and draped in the usual sterile fashion. A scalpel was used to shave an area of skin approximately 1cm by 1/2cm.  Hemostasis achieved with silver nitrite stick. Antibiotic ointment and a sterile dressing applied.  The specimen was sent for pathologic examination. The patient tolerated the procedure well.  EBL: 1 ml  Findings: wart  Condition: Stable  Complications: none.  Plan: 1. Instructed to keep the wound dry and covered for 24-48h and clean thereafter. 2. Warning signs of infection were reviewed.   3. Recommended that the patient use OTC analgesics as needed for pain.  4. Return in prn =.

## 2013-01-13 NOTE — Addendum Note (Signed)
Addended by: Arnette Norris on: 01/13/2013 12:06 PM   Modules accepted: Orders

## 2013-01-13 NOTE — Patient Instructions (Signed)
Warts  Warts are a common viral infection. They are most commonly caused by the human papillomavirus (HPV). Warts can occur at all ages. However, they occur most frequently in older children and infrequently in the elderly. Warts may be single or multiple. Location and size varies. Warts can be spread by scratching the wart and then scratching normal skin. The life cycle of warts varies. However, most will disappear over many months to a couple years. Warts commonly do not cause problems (asymptomatic) unless they are over an area of pressure, such as the bottom of the foot. If they are large enough, they may cause pain with walking.  DIAGNOSIS   Warts are most commonly diagnosed by their appearance. Tissue samples (biopsies) are not required unless the wart looks abnormal. Most warts have a rough surface, are round, oval, or irregular, and are skin-colored to light yellow, brown, or gray. They are generally less than  inch (1.3 cm), but they can be any size.  TREATMENT    Observation or no treatment.   Freezing with liquid nitrogen.   High heat (cautery).   Boosting the body's immunity to fight off the wart (immunotherapy using Candida antigen).   Laser surgery.   Application of various irritants and solutions.  HOME CARE INSTRUCTIONS   Follow your caregiver's instructions. No special precautions are necessary. Often, treatment may be followed by a return (recurrence) of warts. Warts are generally difficult to treat and get rid of. If treatment is done in a clinic setting, usually more than 1 treatment is required. This is usually done on only a monthly basis until the wart is completely gone.  SEEK IMMEDIATE MEDICAL CARE IF:  The treated skin becomes red, puffy (swollen), or painful.  Document Released: 01/30/2005 Document Revised: 07/15/2011 Document Reviewed: 07/28/2009  [employer] Patient Information 2014 [employer].

## 2013-02-22 ENCOUNTER — Ambulatory Visit: Payer: BC Managed Care – PPO | Admitting: Family Medicine

## 2013-02-22 ENCOUNTER — Encounter: Payer: Self-pay | Admitting: Family Medicine

## 2013-02-22 ENCOUNTER — Ambulatory Visit: Payer: BC Managed Care – PPO | Admitting: Internal Medicine

## 2013-02-22 ENCOUNTER — Ambulatory Visit (INDEPENDENT_AMBULATORY_CARE_PROVIDER_SITE_OTHER): Payer: BC Managed Care – PPO | Admitting: Family Medicine

## 2013-02-22 VITALS — BP 121/79 | HR 73 | Temp 99.0°F | Wt 247.4 lb

## 2013-02-22 DIAGNOSIS — J40 Bronchitis, not specified as acute or chronic: Secondary | ICD-10-CM

## 2013-02-22 DIAGNOSIS — M722 Plantar fascial fibromatosis: Secondary | ICD-10-CM

## 2013-02-22 DIAGNOSIS — L089 Local infection of the skin and subcutaneous tissue, unspecified: Secondary | ICD-10-CM

## 2013-02-22 DIAGNOSIS — E669 Obesity, unspecified: Secondary | ICD-10-CM | POA: Insufficient documentation

## 2013-02-22 MED ORDER — CEPHALEXIN 500 MG PO CAPS: 500.0000 mg | ORAL_CAPSULE | Freq: Two times a day (BID) | ORAL | Status: DC

## 2013-02-22 MED ORDER — ALBUTEROL SULFATE HFA 108 (90 BASE) MCG/ACT IN AERS: 2.0000 | INHALATION_SPRAY | Freq: Four times a day (QID) | RESPIRATORY_TRACT | Status: DC | PRN

## 2013-02-22 MED ORDER — MOMETASONE FUROATE 0.1 % EX CREA: TOPICAL_CREAM | Freq: Every day | CUTANEOUS | Status: DC

## 2013-02-22 NOTE — Assessment & Plan Note (Signed)
Keflex for 10 days  Elocon cream rto prn

## 2013-02-22 NOTE — Progress Notes (Signed)
  Subjective:    Patient ID: Mary Knox, female    DOB: 1981-05-04, 32 y.o.   MRN: [id number]  HPI Pt here c/o "a bite" on her low left leg x a few days.  No fever. No joint pains.   Pt also c/o plantar faciits L foot for over a year.      Review of Systems As above    Objective:   Physical Exam BP 121/79  Pulse 73  Temp(Src) 99 F (37.2 C) (Oral)  Wt 247 lb 6.4 oz (112.22 kg)  BMI 38.74 kg/m2  SpO2 96% General appearance: alert, cooperative, appears stated age and no distress Skin: erythema - lower leg(s) left--- + papule about 2 inch in diameter           No drainage         Assessment & Plan:

## 2013-02-22 NOTE — Assessment & Plan Note (Signed)
Refer to podiatry at pt request She has been where a special sock to stretch the area with no relief

## 2013-02-22 NOTE — Patient Instructions (Signed)
Insect Bite  Mosquitoes, flies, fleas, bedbugs, and many other insects can bite. Insect bites are different from insect stings. A sting is when venom is injected into the skin. Some insect bites can transmit infectious diseases.  SYMPTOMS   Insect bites usually turn red, swell, and itch for 2 to 4 days. They often go away on their own.  TREATMENT   Your caregiver may prescribe antibiotic medicines if a bacterial infection develops in the bite.  HOME CARE INSTRUCTIONS   Do not scratch the bite area.   Keep the bite area clean and dry. Wash the bite area thoroughly with soap and water.   Put ice or cool compresses on the bite area.   Put ice in a plastic bag.   Place a towel between your skin and the bag.   Leave the ice on for 20 minutes, 4 times a day for the first 2 to 3 days, or as directed.   You may apply a baking soda paste, cortisone cream, or calamine lotion to the bite area as directed by your caregiver. This can help reduce itching and swelling.   Only take over-the-counter or prescription medicines as directed by your caregiver.   If you are given antibiotics, take them as directed. Finish them even if you start to feel better.  You may need a tetanus shot if:   You cannot remember when you had your last tetanus shot.   You have never had a tetanus shot.   The injury broke your skin.  If you get a tetanus shot, your arm may swell, get red, and feel warm to the touch. This is common and not a problem. If you need a tetanus shot and you choose not to have one, there is a rare chance of getting tetanus. Sickness from tetanus can be serious.  SEEK IMMEDIATE MEDICAL CARE IF:    You have increased pain, redness, or swelling in the bite area.   You see a red line on the skin coming from the bite.   You have a fever.   You have joint pain.   You have a headache or neck pain.   You have unusual weakness.   You have a rash.   You have chest pain or shortness of breath.    You have abdominal pain, nausea, or vomiting.   You feel unusually tired or sleepy.  MAKE SURE YOU:    Understand these instructions.   Will watch your condition.   Will get help right away if you are not doing well or get worse.  Document Released: 05/30/2004 Document Revised: 07/15/2011 Document Reviewed: 11/21/2010  [employer] Patient Information 2014 [employer].

## 2013-03-11 ENCOUNTER — Other Ambulatory Visit: Payer: Self-pay

## 2013-09-20 ENCOUNTER — Ambulatory Visit: Payer: BC Managed Care – PPO | Admitting: Family

## 2013-09-21 ENCOUNTER — Encounter: Payer: Self-pay | Admitting: Family Medicine

## 2013-09-21 ENCOUNTER — Ambulatory Visit (INDEPENDENT_AMBULATORY_CARE_PROVIDER_SITE_OTHER): Payer: BC Managed Care – PPO | Admitting: Family Medicine

## 2013-09-21 VITALS — BP 106/78 | HR 74 | Temp 98.2°F | Resp 16 | Wt 254.4 lb

## 2013-09-21 DIAGNOSIS — J0100 Acute maxillary sinusitis, unspecified: Secondary | ICD-10-CM

## 2013-09-21 DIAGNOSIS — J45909 Unspecified asthma, uncomplicated: Secondary | ICD-10-CM

## 2013-09-21 DIAGNOSIS — J40 Bronchitis, not specified as acute or chronic: Secondary | ICD-10-CM

## 2013-09-21 MED ORDER — PROMETHAZINE-DM 6.25-15 MG/5ML PO SYRP: 5.0000 mL | ORAL_SOLUTION | Freq: Four times a day (QID) | ORAL | Status: DC | PRN

## 2013-09-21 MED ORDER — AMOXICILLIN 875 MG PO TABS: 875.0000 mg | ORAL_TABLET | Freq: Two times a day (BID) | ORAL | Status: DC

## 2013-09-21 MED ORDER — ALBUTEROL SULFATE HFA 108 (90 BASE) MCG/ACT IN AERS: 2.0000 | INHALATION_SPRAY | Freq: Four times a day (QID) | RESPIRATORY_TRACT | Status: DC | PRN

## 2013-09-21 NOTE — Assessment & Plan Note (Signed)
New.  Start abx.  Refill inhaler.  Cough meds prn.  Reviewed supportive care and red flags that should prompt return.  Pt expressed understanding and is in agreement w/ plan.

## 2013-09-21 NOTE — Progress Notes (Signed)
Pre visit review using our clinic review tool, if applicable. No additional management support is needed unless otherwise documented below in the visit note. 

## 2013-09-21 NOTE — Patient Instructions (Signed)
Follow up as needed Start the Amoxicillin twice daily- take w/ food- for the infection Drink plenty of fluids Cough syrup as needed- will cause drowsiness Mucinex DM for daytime cough REST! Call with any questions or concerns Hang in there!

## 2013-09-21 NOTE — Progress Notes (Signed)
   Subjective:    Patient ID: Mary Knox, female    DOB: 10-Mar-1981, 33 y.o.   MRN: [id number]  Cough  Sore Throat  Associated symptoms include coughing.   URI- sxs started w/ nasal congestion, PND, sore throat.  Now w/ productive cough.  sxs started 'a couple days ago'.  No fevers.  No sinus pain/pressure.  No ear pain.  Hx of asthma- increased SOB, had to use inhaler overnight.  + sick contacts.  Needs refill on inhaler   Review of Systems  Respiratory: Positive for cough.    For ROS see HPI     Objective:   Physical Exam  Vitals reviewed. Constitutional: She appears well-developed and well-nourished. No distress.  HENT:  Head: Normocephalic and atraumatic.  Right Ear: Tympanic membrane normal.  Left Ear: Tympanic membrane normal.  Nose: Mucosal edema and rhinorrhea present. Right sinus exhibits maxillary sinus tenderness. Right sinus exhibits no frontal sinus tenderness. Left sinus exhibits maxillary sinus tenderness. Left sinus exhibits no frontal sinus tenderness.  Mouth/Throat: Uvula is midline and mucous membranes are normal. Posterior oropharyngeal erythema present. No oropharyngeal exudate.  Eyes: Conjunctivae and EOM are normal. Pupils are equal, round, and reactive to light.  Neck: Normal range of motion. Neck supple.  Cardiovascular: Normal rate, regular rhythm and normal heart sounds.   Pulmonary/Chest: Effort normal and breath sounds normal. No respiratory distress. She has no wheezes.  Dry, hacking cough  Lymphadenopathy:    She has no cervical adenopathy.          Assessment & Plan:

## 2013-09-21 NOTE — Assessment & Plan Note (Signed)
New.  Pt's sxs and PE consistent w/ infxn.  Start abx.  Reviewed supportive care and red flags that should prompt return.  Pt expressed understanding and is in agreement w/ plan.  

## 2013-10-26 ENCOUNTER — Encounter: Payer: Self-pay | Admitting: Family Medicine

## 2013-10-26 ENCOUNTER — Ambulatory Visit (INDEPENDENT_AMBULATORY_CARE_PROVIDER_SITE_OTHER): Payer: BC Managed Care – PPO | Admitting: Family Medicine

## 2013-10-26 VITALS — BP 120/80 | HR 70 | Temp 98.7°F | Wt 256.0 lb

## 2013-10-26 DIAGNOSIS — J029 Acute pharyngitis, unspecified: Secondary | ICD-10-CM

## 2013-10-26 LAB — POCT RAPID STREP A (OFFICE): RAPID STREP A SCREEN: NEGATIVE

## 2013-10-26 MED ORDER — LORATADINE 10 MG PO TABS: 10.0000 mg | ORAL_TABLET | Freq: Every day | ORAL | Status: DC

## 2013-10-26 MED ORDER — FLUTICASONE PROPIONATE 50 MCG/ACT NA SUSP: 2.0000 | Freq: Every day | NASAL | Status: DC

## 2013-10-26 NOTE — Progress Notes (Signed)
  Subjective:     Mary Knox is a 33 y.o. female who presents for evaluation of sore throat. Associated symptoms include pain while swallowing, post nasal drip and sore throat. Onset of symptoms was 4 days ago, and have been gradually worsening since that time. She is drinking plenty of fluids. She has not had a recent close exposure to someone with proven streptococcal pharyngitis.  The following portions of the patient's history were reviewed and updated as appropriate: allergies, current medications, past family history, past medical history, past social history, past surgical history and problem list.  Review of Systems Pertinent items are noted in HPI.    Objective:    BP 120/80  Pulse 70  Temp(Src) 98.7 F (37.1 C) (Oral)  Wt 256 lb (116.121 kg)  SpO2 98%  LMP 10/12/2013 General appearance: alert, cooperative, appears stated age and no distress Ears: normal TM's and external ear canals both ears Nose: Nares normal. Septum midline. Mucosa normal. No drainage or sinus tenderness. Throat: abnormal findings: mild oropharyngeal erythema and pnd Neck: mild anterior cervical adenopathy, supple, symmetrical, trachea midline and thyroid not enlarged, symmetric, no tenderness/mass/nodules Lungs: clear to auscultation bilaterally Heart: S1, S2 normal  Laboratory Strep test done. Results:negative.    Assessment:    Acute pharyngitis, likely  Viral pharyngitis.    Plan:    Use of OTC analgesics recommended as well as salt water gargles. Follow up as needed.

## 2013-10-26 NOTE — Progress Notes (Signed)
Pre visit review using our clinic review tool, if applicable. No additional management support is needed unless otherwise documented below in the visit note. 

## 2013-10-26 NOTE — Patient Instructions (Signed)
Upper Respiratory Infection, Adult An upper respiratory infection (URI) is also known as the common cold. It is often caused by a type of germ (virus). Colds are easily spread (contagious). You can pass it to others by kissing, coughing, sneezing, or drinking out of the same glass. Usually, you get better in 1 or 2 weeks.  HOME CARE   Only take medicine as told by your doctor.  Use a warm mist humidifier or breathe in steam from a hot shower.  Drink enough water and fluids to keep your pee (urine) clear or pale yellow.  Get plenty of rest.  Return to work when your temperature is back to normal or as told by your doctor. You may use a face mask and wash your hands to stop your cold from spreading. GET HELP RIGHT AWAY IF:   After the first few days, you feel you are getting worse.  You have questions about your medicine.  You have chills, shortness of breath, or brown or red spit (mucus).  You have yellow or brown snot (nasal discharge) or pain in the face, especially when you bend forward.  You have a fever, puffy (swollen) neck, pain when you swallow, or white spots in the back of your throat.  You have a bad headache, ear pain, sinus pain, or chest pain.  You have a high-pitched whistling sound when you breathe in and out (wheezing).  You have a lasting cough or cough up blood.  You have sore muscles or a stiff neck. MAKE SURE YOU:   Understand these instructions.  Will watch your condition.  Will get help right away if you are not doing well or get worse. Document Released: 10/09/2007 Document Revised: 07/15/2011 Document Reviewed: 08/27/2010 Exit[employer] Patient Information 2015 [employer]. This information is not intended to replace advice given to you by your health care provider. Make sure you discuss any questions you have with your health care provider.  

## 2013-10-27 LAB — CULTURE, GROUP A STREP

## 2013-10-28 ENCOUNTER — Ambulatory Visit (INDEPENDENT_AMBULATORY_CARE_PROVIDER_SITE_OTHER): Payer: BC Managed Care – PPO

## 2013-10-28 ENCOUNTER — Ambulatory Visit: Payer: BC Managed Care – PPO | Admitting: Family Medicine

## 2013-10-28 DIAGNOSIS — J020 Streptococcal pharyngitis: Secondary | ICD-10-CM

## 2013-10-28 MED ORDER — PENICILLIN G BENZATHINE 1200000 UNIT/2ML IM SUSP
1.2000 10*6.[IU] | Freq: Once | INTRAMUSCULAR | Status: AC
  Administered 2013-10-28: 1.2 10*6.[IU] via INTRAMUSCULAR

## 2014-03-15 ENCOUNTER — Ambulatory Visit: Payer: BC Managed Care – PPO

## 2014-08-10 ENCOUNTER — Encounter: Payer: Self-pay | Admitting: Physician Assistant

## 2014-08-10 ENCOUNTER — Ambulatory Visit (INDEPENDENT_AMBULATORY_CARE_PROVIDER_SITE_OTHER): Payer: BC Managed Care – PPO | Admitting: Physician Assistant

## 2014-08-10 VITALS — BP 131/83 | HR 104 | Temp 100.7°F | Resp 16 | Ht 67.0 in | Wt 275.1 lb

## 2014-08-10 DIAGNOSIS — R52 Pain, unspecified: Secondary | ICD-10-CM

## 2014-08-10 DIAGNOSIS — R5383 Other fatigue: Secondary | ICD-10-CM

## 2014-08-10 DIAGNOSIS — R509 Fever, unspecified: Secondary | ICD-10-CM

## 2014-08-10 DIAGNOSIS — J020 Streptococcal pharyngitis: Secondary | ICD-10-CM | POA: Diagnosis not present

## 2014-08-10 LAB — POCT INFLUENZA A/B
INFLUENZA A, POC: NEGATIVE
INFLUENZA B, POC: NEGATIVE

## 2014-08-10 LAB — POCT RAPID STREP A (OFFICE): RAPID STREP A SCREEN: POSITIVE — AB

## 2014-08-10 MED ORDER — AMOXICILLIN 875 MG PO TABS: 875.0000 mg | ORAL_TABLET | Freq: Two times a day (BID) | ORAL | Status: DC

## 2014-08-10 NOTE — Progress Notes (Signed)
Pre visit review using our clinic review tool, if applicable. No additional management support is needed unless otherwise documented below in the visit note/SLS  

## 2014-08-10 NOTE — Progress Notes (Signed)
Patient presents to clinic today c/o 1 day of severe sore throat, head congestion and sinus pressure associated with fever > 101 last night, chills and fatigue.  Denies SOB or chest pain.  Denies ear pain or tooth pain.  Works as a Pharmacist, hospital.    Past Medical History  Diagnosis Date  . Asthma   . Bipolar disorder   . Depression     Current Outpatient Prescriptions on File Prior to Visit  Medication Sig Dispense Refill  . albuterol (PROAIR HFA) 108 (90 BASE) MCG/ACT inhaler Inhale 2 puffs into the lungs every 6 (six) hours as needed for wheezing. 1 Inhaler 1  . ARIPiprazole (ABILIFY) 10 MG tablet Take 10 mg by mouth daily.    Marland Kitchen lamoTRIgine (LAMICTAL) 200 MG tablet Take 200 mg by mouth daily.    Marland Kitchen levothyroxine (SYNTHROID, LEVOTHROID) 25 MCG tablet Take 25 mcg by mouth daily before breakfast.      No current facility-administered medications on file prior to visit.    No Known Allergies  Family History  Problem Relation Age of Onset  . Alcohol abuse Father   . Diabetes Paternal Grandfather   . Hypertension    . Stroke Paternal Grandfather   . Bipolar disorder Father     also OCD  . Parkinsonism    . Hypothyroidism Mother     History   Social History  . Marital Status: Married    Spouse Name: N/A  . Number of Children: N/A  . Years of Education: N/A   Social History Main Topics  . Smoking status: Never Smoker   . Smokeless tobacco: Never Used  . Alcohol Use: Yes     Comment:  rarely  . Drug Use: No  . Sexual Activity: Not on file   Other Topics Concern  . None   Social History Narrative   Review of Systems - See HPI.  All other ROS are negative.  BP 131/83 mmHg  Pulse 104  Temp(Src) 100.7 F (38.2 C) (Oral)  Resp 16  Ht 5\' 7"  (1.702 m)  Wt 275 lb 2 oz (124.796 kg)  BMI 43.08 kg/m2  SpO2 100%  LMP   Physical Exam  Constitutional: She is oriented to person, place, and time and well-developed, well-nourished, and in no distress.  HENT:  Head:  Normocephalic and atraumatic.  Right Ear: Tympanic membrane, external ear and ear canal normal.  Left Ear: Tympanic membrane, external ear and ear canal normal.  Nose: Right sinus exhibits no maxillary sinus tenderness and no frontal sinus tenderness. Left sinus exhibits no maxillary sinus tenderness and no frontal sinus tenderness.  Mouth/Throat: Uvula is midline and mucous membranes are normal. Oropharyngeal exudate and posterior oropharyngeal erythema present. No tonsillar abscesses.  Eyes: Conjunctivae are normal.  Neck: Neck supple.  Cardiovascular: Normal rate, regular rhythm, normal heart sounds and intact distal pulses.   Pulmonary/Chest: Effort normal and breath sounds normal. No respiratory distress. She has no wheezes. She has no rales. She exhibits no tenderness.  Lymphadenopathy:    She has cervical adenopathy.  Neurological: She is alert and oriented to person, place, and time.  Skin: Skin is warm and dry. No rash noted.  Psychiatric: Affect normal.  Vitals reviewed.   Recent Results (from the past 2160 hour(s))  POCT rapid strep A     Status: Abnormal   Collection Time: 08/10/14 11:31 AM  Result Value Ref Range   Rapid Strep A Screen Positive (A) Negative  POCT Influenza A/B  Status: Normal   Collection Time: 08/10/14 11:47 AM  Result Value Ref Range   Influenza A, POC Negative    Influenza B, POC Negative     Assessment/Plan: Strep pharyngitis Strep positive. Rx Amoxicillin.   Alternate tylenol and ibuprofen as directed. Salt-water gargles. Humidifier in bedroom. Follow-up if symptoms are not improving.

## 2014-08-10 NOTE — Assessment & Plan Note (Addendum)
Strep positive. Rx Amoxicillin.   Alternate tylenol and ibuprofen as directed. Salt-water gargles. Humidifier in bedroom. Follow-up if symptoms are not improving.

## 2014-08-10 NOTE — Patient Instructions (Signed)
Please take the Amoxicillin twice daily with food as directed. Increase fluids.   Rest. Place a humidifier in the bedroom. Alternate tylenol and ibuprofen for fever and sore throat. Salt-water gargles may also be beneficial.  You are contagious until you have been on antibiotics for 24 hours. Please change out your toothbrush in a few days.  Strep Throat Strep throat is an infection of the throat caused by a bacteria named Streptococcus pyogenes. Your health care provider may call the infection streptococcal "tonsillitis" or "pharyngitis" depending on whether there are signs of inflammation in the tonsils or back of the throat. Strep throat is most common in children aged 5-15 years during the cold months of the year, but it can occur in people of any age during any season. This infection is spread from person to person (contagious) through coughing, sneezing, or other close contact. SIGNS AND SYMPTOMS   Fever or chills.  Painful, swollen, red tonsils or throat.  Pain or difficulty when swallowing.  White or yellow spots on the tonsils or throat.  Swollen, tender lymph nodes or "glands" of the neck or under the jaw.  Red rash all over the body (rare). DIAGNOSIS  Many different infections can cause the same symptoms. A test must be done to confirm the diagnosis so the right treatment can be given. A "rapid strep test" can help your health care provider make the diagnosis in a few minutes. If this test is not available, a light swab of the infected area can be used for a throat culture test. If a throat culture test is done, results are usually available in a day or two. TREATMENT  Strep throat is treated with antibiotic medicine. HOME CARE INSTRUCTIONS   Gargle with 1 tsp of salt in 1 cup of warm water, 3-4 times per day or as needed for comfort.  Family members who also have a sore throat or fever should be tested for strep throat and treated with antibiotics if they have the strep  infection.  Make sure everyone in your household washes their hands well.  Do not share food, drinking cups, or personal items that could cause the infection to spread to others.  You may need to eat a soft food diet until your sore throat gets better.  Drink enough water and fluids to keep your urine clear or pale yellow. This will help prevent dehydration.  Get plenty of rest.  Stay home from school, day care, or work until you have been on antibiotics for 24 hours.  Take medicines only as directed by your health care provider.  Take your antibiotic medicine as directed by your health care provider. Finish it even if you start to feel better. SEEK MEDICAL CARE IF:   The glands in your neck continue to enlarge.  You develop a rash, cough, or earache.  You cough up green, yellow-brown, or bloody sputum.  You have pain or discomfort not controlled by medicines.  Your problems seem to be getting worse rather than better.  You have a fever. SEEK IMMEDIATE MEDICAL CARE IF:   You develop any new symptoms such as vomiting, severe headache, stiff or painful neck, chest pain, shortness of breath, or trouble swallowing.  You develop severe throat pain, drooling, or changes in your voice.  You develop swelling of the neck, or the skin on the neck becomes red and tender.  You develop signs of dehydration, such as fatigue, dry mouth, and decreased urination.  You become increasingly sleepy, or  you cannot wake up completely. MAKE SURE YOU:  Understand these instructions.  Will watch your condition.  Will get help right away if you are not doing well or get worse. Document Released: 04/19/2000 Document Revised: 09/06/2013 Document Reviewed: 06/21/2010 [facility] Patient Information 2015 [place], [place]. This information is not intended to replace advice given to you by your health care provider. Make sure you discuss any questions you have with your health care provider.

## 2015-04-24 ENCOUNTER — Encounter: Payer: Self-pay | Admitting: Physician Assistant

## 2015-04-24 ENCOUNTER — Ambulatory Visit (INDEPENDENT_AMBULATORY_CARE_PROVIDER_SITE_OTHER): Payer: BC Managed Care – PPO | Admitting: Physician Assistant

## 2015-04-24 VITALS — BP 116/78 | HR 92 | Temp 98.7°F | Ht 67.0 in | Wt 274.8 lb

## 2015-04-24 DIAGNOSIS — J019 Acute sinusitis, unspecified: Secondary | ICD-10-CM

## 2015-04-24 DIAGNOSIS — B9689 Other specified bacterial agents as the cause of diseases classified elsewhere: Secondary | ICD-10-CM | POA: Insufficient documentation

## 2015-04-24 MED ORDER — AMOXICILLIN-POT CLAVULANATE 875-125 MG PO TABS: 1.0000 | ORAL_TABLET | Freq: Two times a day (BID) | ORAL | Status: DC

## 2015-04-24 NOTE — Assessment & Plan Note (Signed)
Rx Augmentin.  Increase fluids.  Rest.  Saline nasal spray.  Probiotic.  Mucinex as directed.  Humidifier in bedroom.  Call or return to clinic if symptoms are not improving.  

## 2015-04-24 NOTE — Progress Notes (Signed)
Pre visit review using our clinic review tool, if applicable. No additional management support is needed unless otherwise documented below in the visit note. 

## 2015-04-24 NOTE — Patient Instructions (Signed)
Please take antibiotic as directed.  Increase fluid intake.  Use Saline nasal spray.  Take a daily multivitamin.  Place a humidifier in the bedroom.  Please call or return clinic if symptoms are not improving.  Sinusitis Sinusitis is redness, soreness, and swelling (inflammation) of the paranasal sinuses. Paranasal sinuses are air pockets within the bones of your face (beneath the eyes, the middle of the forehead, or above the eyes). In healthy paranasal sinuses, mucus is able to drain out, and air is able to circulate through them by way of your nose. However, when your paranasal sinuses are inflamed, mucus and air can become trapped. This can allow bacteria and other germs to grow and cause infection. Sinusitis can develop quickly and last only a short time (acute) or continue over a long period (chronic). Sinusitis that lasts for more than 12 weeks is considered chronic.  CAUSES  Causes of sinusitis include:  Allergies.  Structural abnormalities, such as displacement of the cartilage that separates your nostrils (deviated septum), which can decrease the air flow through your nose and sinuses and affect sinus drainage.  Functional abnormalities, such as when the small hairs (cilia) that line your sinuses and help remove mucus do not work properly or are not present. SYMPTOMS  Symptoms of acute and chronic sinusitis are the same. The primary symptoms are pain and pressure around the affected sinuses. Other symptoms include:  Upper toothache.  Earache.  Headache.  Bad breath.  Decreased sense of smell and taste.  A cough, which worsens when you are lying flat.  Fatigue.  Fever.  Thick drainage from your nose, which often is green and may contain pus (purulent).  Swelling and warmth over the affected sinuses. DIAGNOSIS  Your caregiver will perform a physical exam. During the exam, your caregiver may:  Look in your nose for signs of abnormal growths in your nostrils (nasal  polyps).  Tap over the affected sinus to check for signs of infection.  View the inside of your sinuses (endoscopy) with a special imaging device with a light attached (endoscope), which is inserted into your sinuses. If your caregiver suspects that you have chronic sinusitis, one or more of the following tests may be recommended:  Allergy tests.  Nasal culture A sample of mucus is taken from your nose and sent to a lab and screened for bacteria.  Nasal cytology A sample of mucus is taken from your nose and examined by your caregiver to determine if your sinusitis is related to an allergy. TREATMENT  Most cases of acute sinusitis are related to a viral infection and will resolve on their own within 10 days. Sometimes medicines are prescribed to help relieve symptoms (pain medicine, decongestants, nasal steroid sprays, or saline sprays).  However, for sinusitis related to a bacterial infection, your caregiver will prescribe antibiotic medicines. These are medicines that will help kill the bacteria causing the infection.  Rarely, sinusitis is caused by a fungal infection. In theses cases, your caregiver will prescribe antifungal medicine. For some cases of chronic sinusitis, surgery is needed. Generally, these are cases in which sinusitis recurs more than 3 times per year, despite other treatments. HOME CARE INSTRUCTIONS   Drink plenty of water. Water helps thin the mucus so your sinuses can drain more easily.  Use a humidifier.  Inhale steam 3 to 4 times a day (for example, sit in the bathroom with the shower running).  Apply a warm, moist washcloth to your face 3 to 4 times a day,   day, or as directed by your caregiver.  Use saline nasal sprays to help moisten and clean your sinuses.  Take over-the-counter or prescription medicines for pain, discomfort, or fever only as directed by your caregiver. SEEK IMMEDIATE MEDICAL CARE IF:  You have increasing pain or severe headaches.  You have  nausea, vomiting, or drowsiness.  You have swelling around your face.  You have vision problems.  You have a stiff neck.  You have difficulty breathing. MAKE SURE YOU:   Understand these instructions.  Will watch your condition.  Will get help right away if you are not doing well or get worse. Document Released: 04/22/2005 Document Revised: 07/15/2011 Document Reviewed: 05/07/2011 [facility] Patient Information 2014 [place], [place].

## 2015-04-24 NOTE — Progress Notes (Signed)
   Patient presents to clinic today c/o 4 days of productive cough, chest congestion, sinus pressure and sinus pain. Endorses fever x 2 days up to 100. Denies recent travel or sick contact. Has history of asthma and has used SABA once since symptom onset. Went to [facility] on Saturday and had negative strep testing.   Past Medical History  Diagnosis Date  . Asthma   . Bipolar disorder ([place])   . Depression     Current Outpatient Prescriptions on File Prior to Visit  Medication Sig Dispense Refill  . ARIPiprazole (ABILIFY) 10 MG tablet Take 10 mg by mouth daily.    . Cholecalciferol (VITAMIN D) 2000 UNITS CAPS Take by mouth.    . lamoTRIgine (LAMICTAL) 200 MG tablet Take 200 mg by mouth daily.    Marland Kitchen levothyroxine (SYNTHROID, LEVOTHROID) 25 MCG tablet Take 25 mcg by mouth daily before breakfast.      No current facility-administered medications on file prior to visit.    No Known Allergies  Family History  Problem Relation Age of Onset  . Alcohol abuse Father   . Diabetes Paternal Grandfather   . Hypertension    . Stroke Paternal Grandfather   . Bipolar disorder Father     also OCD  . Parkinsonism    . Hypothyroidism Mother     Social History   Social History  . Marital Status: Married    Spouse Name: N/A  . Number of Children: N/A  . Years of Education: N/A   Social History Main Topics  . Smoking status: Never Smoker   . Smokeless tobacco: Never Used  . Alcohol Use: Yes     Comment:  rarely  . Drug Use: No  . Sexual Activity: Not Asked   Other Topics Concern  . None   Social History Narrative   Review of Systems - See HPI.  All other ROS are negative.  BP 116/78 mmHg  Pulse 92  Temp(Src) 98.7 F (37.1 C) (Oral)  Ht 5\' 7"  (1.702 m)  Wt 274 lb 12.8 oz (124.648 kg)  BMI 43.03 kg/m2  SpO2 97%  LMP 04/21/2015  Physical Exam  Constitutional: She is well-developed, well-nourished, and in no distress.  HENT:  Head: Normocephalic and atraumatic.  Right Ear:  External ear normal.  Left Ear: External ear normal.  Nose: Nose normal.  Mouth/Throat: Oropharynx is clear and moist. No oropharyngeal exudate.  + TTP of sinuses.  Eyes: Conjunctivae are normal.  Cardiovascular: Normal rate, regular rhythm, normal heart sounds and intact distal pulses.   Pulmonary/Chest: Effort normal and breath sounds normal. No respiratory distress. She has no wheezes. She has no rales. She exhibits no tenderness.  Abdominal: Soft. Bowel sounds are normal. She exhibits no distension.  Vitals reviewed.   No results found for this or any previous visit (from the past 2160 hour(s)).  Assessment/Plan: Acute bacterial sinusitis Rx Augmentin.  Increase fluids.  Rest.  Saline nasal spray.  Probiotic.  Mucinex as directed.  Humidifier in bedroom.  Call or return to clinic if symptoms are not improving.

## 2015-05-04 ENCOUNTER — Telehealth: Payer: Self-pay | Admitting: Family Medicine

## 2015-05-04 ENCOUNTER — Ambulatory Visit: Payer: BC Managed Care – PPO | Admitting: Family Medicine

## 2015-05-04 NOTE — Telephone Encounter (Signed)
Caller name: Mercy   Relationship to patient: Self   Can be reached:[phone]  Reason for call: Pt called in at 1:30 stating that she will not be able to make her appt. Should pt be charged a no show fee?

## 2015-05-04 NOTE — Telephone Encounter (Signed)
yes

## 2015-05-05 ENCOUNTER — Ambulatory Visit (INDEPENDENT_AMBULATORY_CARE_PROVIDER_SITE_OTHER): Payer: BC Managed Care – PPO | Admitting: Family Medicine

## 2015-05-05 ENCOUNTER — Encounter: Payer: Self-pay | Admitting: Family Medicine

## 2015-05-05 VITALS — BP 112/76 | HR 67 | Temp 98.5°F | Resp 20 | Wt 273.2 lb

## 2015-05-05 DIAGNOSIS — B9689 Other specified bacterial agents as the cause of diseases classified elsewhere: Secondary | ICD-10-CM

## 2015-05-05 DIAGNOSIS — J019 Acute sinusitis, unspecified: Secondary | ICD-10-CM

## 2015-05-05 MED ORDER — GUAIFENESIN ER 600 MG PO TB12: 600.0000 mg | ORAL_TABLET | Freq: Two times a day (BID) | ORAL | Status: DC | PRN

## 2015-05-05 MED ORDER — FLUTICASONE PROPIONATE 50 MCG/ACT NA SUSP: 2.0000 | Freq: Every day | NASAL | Status: DC

## 2015-05-05 NOTE — Patient Instructions (Signed)
Sinusitis, Adult Sinusitis is redness, soreness, and inflammation of the paranasal sinuses. Paranasal sinuses are air pockets within the bones of your face. They are located beneath your eyes, in the middle of your forehead, and above your eyes. In healthy paranasal sinuses, mucus is able to drain out, and air is able to circulate through them by way of your nose. However, when your paranasal sinuses are inflamed, mucus and air can become trapped. This can allow bacteria and other germs to grow and cause infection. Sinusitis can develop quickly and last only a short time (acute) or continue over a long period (chronic). Sinusitis that lasts for more than 12 weeks is considered chronic. CAUSES Causes of sinusitis include:  Allergies.  Structural abnormalities, such as displacement of the cartilage that separates your nostrils (deviated septum), which can decrease the air flow through your nose and sinuses and affect sinus drainage.  Functional abnormalities, such as when the small hairs (cilia) that line your sinuses and help remove mucus do not work properly or are not present. SIGNS AND SYMPTOMS Symptoms of acute and chronic sinusitis are the same. The primary symptoms are pain and pressure around the affected sinuses. Other symptoms include:  Upper toothache.  Earache.  Headache.  Bad breath.  Decreased sense of smell and taste.  A cough, which worsens when you are lying flat.  Fatigue.  Fever.  Thick drainage from your nose, which often is green and may contain pus (purulent).  Swelling and warmth over the affected sinuses. DIAGNOSIS Your health care provider will perform a physical exam. During your exam, your health care provider may perform any of the following to help determine if you have acute sinusitis or chronic sinusitis:  Look in your nose for signs of abnormal growths in your nostrils (nasal polyps).  Tap over the affected sinus to check for signs of  infection.  View the inside of your sinuses using an imaging device that has a light attached (endoscope). If your health care provider suspects that you have chronic sinusitis, one or more of the following tests may be recommended:  Allergy tests.  Nasal culture. A sample of mucus is taken from your nose, sent to a lab, and screened for bacteria.  Nasal cytology. A sample of mucus is taken from your nose and examined by your health care provider to determine if your sinusitis is related to an allergy. TREATMENT Most cases of acute sinusitis are related to a viral infection and will resolve on their own within 10 days. Sometimes, medicines are prescribed to help relieve symptoms of both acute and chronic sinusitis. These may include pain medicines, decongestants, nasal steroid sprays, or saline sprays. However, for sinusitis related to a bacterial infection, your health care provider will prescribe antibiotic medicines. These are medicines that will help kill the bacteria causing the infection. Rarely, sinusitis is caused by a fungal infection. In these cases, your health care provider will prescribe antifungal medicine. For some cases of chronic sinusitis, surgery is needed. Generally, these are cases in which sinusitis recurs more than 3 times per year, despite other treatments. HOME CARE INSTRUCTIONS  Drink plenty of water. Water helps thin the mucus so your sinuses can drain more easily.  Use a humidifier.  Inhale steam 3-4 times a day (for example, sit in the bathroom with the shower running).  Apply a warm, moist washcloth to your face 3-4 times a day, or as directed by your health care provider.  Use saline nasal sprays to help   moisten and clean your sinuses.  Take medicines only as directed by your health care provider.  If you were prescribed either an antibiotic or antifungal medicine, finish it all even if you start to feel better. SEEK IMMEDIATE MEDICAL CARE IF:  You have  increasing pain or severe headaches.  You have nausea, vomiting, or drowsiness.  You have swelling around your face.  You have vision problems.  You have a stiff neck.  You have difficulty breathing.   This information is not intended to replace advice given to you by your health care provider. Make sure you discuss any questions you have with your health care provider.   Document Released: 04/22/2005 Document Revised: 05/13/2014 Document Reviewed: 05/07/2011 Elsevier Interactive Patient Education [employer].   I believe are seeing the residual effects of your sinus infection. I do not believe you have an active infection any longer.  Use the nasal saline, flonase and mucinex as prescribed.  Try a humidifier at night.  Cough may last a few weeks after infection.

## 2015-05-05 NOTE — Progress Notes (Signed)
   Subjective:    Patient ID: Mary Knox   DOB: [birth year], 34 y.o.    MRN: [id number]  HPI  Sinus pressure: Pt presents for acute OV for a 2 week complaint of fever (101)- last fever 1 week ago, cough, sinus pressure, nasal congestion, rhinorrhea, chills, nausea x1, fatigue. She denies vomit. Eating and drinking well.  Started Augmentin 7 day course, at [place] 2 weeks ago and finished last Monday. Started to feel better, but now diarrheax1 and coughing remains. She was using mucinex, but stopped. History of asthma, not needed inhaler in a couple of days.    Past Medical History  Diagnosis Date  . Asthma   . Bipolar disorder ([place])   . Depression    No Known Allergies Past Surgical History  Procedure Laterality Date  . Wisdom tooth extraction     Social History  Substance Use Topics  . Smoking status: Never Smoker   . Smokeless tobacco: Never Used  . Alcohol Use: Yes     Comment:  rarely   Review of Systems Negative, with the exception of above mentioned in HPI  Objective:   Physical Exam BP 112/76 mmHg  Pulse 67  Temp(Src) 98.5 F (36.9 C)  Resp 20  Wt 273 lb 4 oz (123.945 kg)  SpO2 96%  LMP 04/21/2015 Body mass index is 42.79 kg/(m^2). Gen: Afebrile. No acute distress. Nontoxic in appearance. Well developed, well nourished, pleasant.  HENT: AT. [place]. Bilateral TM visualized, no erythema or bulging. MMM, no oral lesions. Bilateral nares with erythema and swelling. Throat without erythema, No exudates. Cough mild, Hoarseness not present, TTP of facial sinus not present.  Eyes:Pupils Equal Round Reactive to light, Extraocular movements intact,  Conjunctiva without redness, discharge or icterus. Neck/lymp/endocrine: Supple,shotty lymphadenopathy CV: RRR  Chest: CTAB, no wheeze or crackles Abd: Soft. NTND. BS present Skin: No rashes, purpura or petechiae.  Assessment & Plan:  Mary Knox is a 34 y.o. present for acute OV with minor symptoms from her prior sinus infection.  She is improved and afebrile for over a week. Cough remains, pt instructed cough may last for a few weeks.  1. Acute bacterial sinusitis - Pt encouraged symptomatic treatment - Flonase and mucinex start.  - Start using nasal saline 3x daily.  - fluticasone (FLONASE) 50 MCG/ACT nasal spray; Place 2 sprays into both nostrils daily.  Dispense: 16 g; Refill: 2 - guaiFENesin (MUCINEX) 600 MG 12 hr tablet; Take 1 tablet (600 mg total) by mouth 2 (two) times daily as needed.  Dispense: 30 tablet; Refill: 1' - AVS provided to pt.  - F/U prn

## 2015-05-12 ENCOUNTER — Encounter: Payer: Self-pay | Admitting: Family Medicine

## 2015-05-12 ENCOUNTER — Ambulatory Visit (INDEPENDENT_AMBULATORY_CARE_PROVIDER_SITE_OTHER): Payer: BC Managed Care – PPO | Admitting: Family Medicine

## 2015-05-12 VITALS — BP 120/70 | HR 67 | Temp 98.3°F | Wt 277.0 lb

## 2015-05-12 DIAGNOSIS — J209 Acute bronchitis, unspecified: Secondary | ICD-10-CM | POA: Diagnosis not present

## 2015-05-12 DIAGNOSIS — R05 Cough: Secondary | ICD-10-CM | POA: Diagnosis not present

## 2015-05-12 DIAGNOSIS — R059 Cough, unspecified: Secondary | ICD-10-CM

## 2015-05-12 MED ORDER — AZITHROMYCIN 250 MG PO TABS
ORAL_TABLET | ORAL | Status: DC
Start: 2015-05-12 — End: 2015-07-25

## 2015-05-12 MED ORDER — PREDNISONE 10 MG PO TABS: ORAL_TABLET | ORAL | Status: DC

## 2015-05-12 MED ORDER — GUAIFENESIN-CODEINE 100-10 MG/5ML PO SYRP: ORAL_SOLUTION | ORAL | Status: DC

## 2015-05-12 MED FILL — predniSONE 10 MG TABS: 10 | 9 days supply | Qty: 18 | Fill #0

## 2015-05-12 MED FILL — AZITHROMYCIN 250 MG TABLET: 250 | 5 days supply | Qty: 6 | Fill #0

## 2015-05-12 MED FILL — GUAIATUSSIN AC LIQUID: 100-10 | 24 days supply | Qty: 120 | Fill #0

## 2015-05-12 NOTE — Progress Notes (Signed)
Pre visit review using our clinic review tool, if applicable. No additional management support is needed unless otherwise documented below in the visit note. 

## 2015-05-12 NOTE — Progress Notes (Signed)
SAntibiotics per medication orders. Antitussives per medication orders. Avoid exposure to tobacco smoke and fumes. B-agonist inhaler. Call if shortness of breath worsens, blood in sputum, change in character of cough, development of fever or chills, inability to maintain nutrition and hydration. Avoid exposure to tobacco smoke and fumes. ubjective:     Mary Knox is a 35 y.o. female here for evaluation of a cough. Onset of symptoms was 3 weeks ago. Symptoms have been gradually worsening since that time. The cough is productive and is aggravated by reclining position. Associated symptoms include: sputum production and wheezing. Patient does have a history of asthma. Patient does not have a history of environmental allergens. Patient has not traveled recently. Patient does not have a history of smoking. Patient has not had a previous chest x-ray. Patient has not had a PPD done.  The following portions of the patient's history were reviewed and updated as appropriate:  She  has a past medical history of Asthma; Bipolar disorder ([place]); and Depression. She  does not have any pertinent problems on file. She  has past surgical history that includes Wisdom tooth extraction. Her family history includes Alcohol abuse in her father; Bipolar disorder in her father; Diabetes in her paternal grandfather; Hypothyroidism in her mother; Stroke in her paternal grandfather. She  reports that she has never smoked. She has never used smokeless tobacco. She reports that she drinks alcohol. She reports that she does not use illicit drugs. She has a current medication list which includes the following prescription(s): albuterol, aripiprazole, vitamin d, fluticasone, guaifenesin, lamotrigine, and levothyroxine. Current Outpatient Prescriptions on File Prior to Visit  Medication Sig Dispense Refill  . albuterol (PROVENTIL HFA;VENTOLIN HFA) 108 (90 BASE) MCG/ACT inhaler Inhale 2 puffs into the lungs as needed for  wheezing or shortness of breath.    . ARIPiprazole (ABILIFY) 10 MG tablet Take 10 mg by mouth daily.    . Cholecalciferol (VITAMIN D) 2000 UNITS CAPS Take by mouth.    . fluticasone (FLONASE) 50 MCG/ACT nasal spray Place 2 sprays into both nostrils daily. 16 g 2  . guaiFENesin (MUCINEX) 600 MG 12 hr tablet Take 1 tablet (600 mg total) by mouth 2 (two) times daily as needed. 30 tablet 1  . lamoTRIgine (LAMICTAL) 200 MG tablet Take 200 mg by mouth daily.    Marland Kitchen levothyroxine (SYNTHROID, LEVOTHROID) 25 MCG tablet Take 25 mcg by mouth daily before breakfast.      No current facility-administered medications on file prior to visit.   She has No Known Allergies..  Review of Systems Pertinent items are noted in HPI.    Objective:    Oxygen saturation 98% on room air BP 120/70 mmHg  Pulse 67  Temp(Src) 98.3 F (36.8 C) (Oral)  Wt 277 lb (125.646 kg)  SpO2 98%  LMP 04/21/2015 General appearance: alert, cooperative, appears stated age and no distress Ears: normal TM's and external ear canals both ears Nose: Nares normal. Septum midline. Mucosa normal. No drainage or sinus tenderness. Throat: lips, mucosa, and tongue normal; teeth and gums normal Neck: mild anterior cervical adenopathy, supple, symmetrical, trachea midline and thyroid not enlarged, symmetric, no tenderness/mass/nodules Lungs: diminished breath sounds bilaterally and wheezes bilaterally Heart: S1, S2 normal    Assessment:    Acute Bronchitis    Plan:   pred taperAntibiotics per medication orders. Antitussives per medication orders. Avoid exposure to tobacco smoke and fumes. B-agonist inhaler. Call if shortness of breath worsens, blood in sputum, change in character of cough, development  of fever or chills, inability to maintain nutrition and hydration. Avoid exposure to tobacco smoke and fumes. pred taper

## 2015-05-12 NOTE — Patient Instructions (Signed)

## 2015-05-16 ENCOUNTER — Encounter: Payer: Self-pay | Admitting: Internal Medicine

## 2015-05-16 ENCOUNTER — Ambulatory Visit (INDEPENDENT_AMBULATORY_CARE_PROVIDER_SITE_OTHER): Payer: BC Managed Care – PPO | Admitting: Internal Medicine

## 2015-05-16 ENCOUNTER — Ambulatory Visit (HOSPITAL_BASED_OUTPATIENT_CLINIC_OR_DEPARTMENT_OTHER)
Admission: RE | Admit: 2015-05-16 | Discharge: 2015-05-16 | Disposition: A | Discharge status: Home / Self Care | Payer: BC Managed Care – PPO | Source: Ambulatory Visit | Attending: Family Medicine | Admitting: Family Medicine

## 2015-05-16 ENCOUNTER — Telehealth: Payer: Self-pay | Admitting: Family Medicine

## 2015-05-16 VITALS — BP 124/66 | HR 80 | Temp 98.3°F | Ht 67.0 in | Wt 275.5 lb

## 2015-05-16 DIAGNOSIS — R059 Cough, unspecified: Secondary | ICD-10-CM

## 2015-05-16 DIAGNOSIS — R05 Cough: Secondary | ICD-10-CM | POA: Insufficient documentation

## 2015-05-16 DIAGNOSIS — J4540 Moderate persistent asthma, uncomplicated: Secondary | ICD-10-CM

## 2015-05-16 MED ORDER — PREDNISONE 10 MG PO TABS: ORAL_TABLET | ORAL | Status: DC

## 2015-05-16 MED ORDER — BUDESONIDE-FORMOTEROL FUMARATE 160-4.5 MCG/ACT IN AERO: 2.0000 | INHALATION_SPRAY | Freq: Two times a day (BID) | RESPIRATORY_TRACT | Status: DC

## 2015-05-16 MED FILL — SYMBICORT 160-4.5 MCG INH: 160-4.5 | 30 days supply | Qty: 10 | Fill #0

## 2015-05-16 NOTE — Telephone Encounter (Signed)
Ok to get cxr

## 2015-05-16 NOTE — Progress Notes (Signed)
Pre visit review using our clinic review tool, if applicable. No additional management support is needed unless otherwise documented below in the visit note. 

## 2015-05-16 NOTE — Progress Notes (Signed)
Subjective:    Patient ID: Mary Knox, female    DOB: 08-Jun-1980, 35 y.o.   MRN: [id number]  DOS:  05/16/2015 Type of visit - description : Acute visit Interval history: Was seen a few days ago at this office with cough, wheezing. Prescribed prednisone, codeine and a Z-Pak. Cough is a slightly better but still there, still has episodes of severe cough.   Review of Systems  Denies fever, had chills they are gone No actual chest pain or difficulty breathing. No nausea vomiting, diarrhea 1 today, small amount. No myalgias  Past Medical History  Diagnosis Date  . Asthma   . Bipolar disorder ([place])   . Depression     Past Surgical History  Procedure Laterality Date  . Wisdom tooth extraction      Social History   Social History  . Marital Status: Married    Spouse Name: N/A  . Number of Children: N/A  . Years of Education: N/A   Occupational History  . Not on file.   Social History Main Topics  . Smoking status: Never Smoker   . Smokeless tobacco: Never Used  . Alcohol Use: Yes     Comment:  rarely  . Drug Use: No  . Sexual Activity: Not on file   Other Topics Concern  . Not on file   Social History Narrative        Medication List       This list is accurate as of: 05/16/15 11:59 PM.  Always use your most recent med list.               albuterol 108 (90 Base) MCG/ACT inhaler  Commonly known as:  PROVENTIL HFA;VENTOLIN HFA  Inhale 2 puffs into the lungs as needed for wheezing or shortness of breath.     ARIPiprazole 10 MG tablet  Commonly known as:  ABILIFY  Take 10 mg by mouth daily.     azithromycin 250 MG tablet  Commonly known as:  ZITHROMAX Z-PAK  As directed     budesonide-formoterol 160-4.5 MCG/ACT inhaler  Commonly known as:  SYMBICORT  Inhale 2 puffs into the lungs 2 (two) times daily.     fluticasone 50 MCG/ACT nasal spray  Commonly known as:  FLONASE  Place 2 sprays into both nostrils daily.     guaiFENesin 600 MG 12 hr  tablet  Commonly known as:  MUCINEX  Take 1 tablet (600 mg total) by mouth 2 (two) times daily as needed.     guaiFENesin-codeine 100-10 MG/5ML syrup  Commonly known as:  ROBITUSSIN AC  1 tsp po qhs prn cough     lamoTRIgine 200 MG tablet  Commonly known as:  LAMICTAL  Take 200 mg by mouth daily.     levothyroxine 25 MCG tablet  Commonly known as:  SYNTHROID, LEVOTHROID  Take 25 mcg by mouth daily before breakfast.     predniSONE 10 MG tablet  Commonly known as:  DELTASONE  4 tablets x 2 days, 3 tabs x 2 days, 2 tabs x 2 days, 1 tab x 2 days     Vitamin D 2000 units Caps  Take by mouth.           Objective:   Physical Exam BP 124/66 mmHg  Pulse 80  Temp(Src) 98.3 F (36.8 C) (Oral)  Ht 5\' 7"  (1.702 m)  Wt 275 lb 8 oz (124.966 kg)  BMI 43.14 kg/m2  SpO2 97%  LMP 04/21/2015 (LMP Unknown) General:  Well developed, well nourished . NAD.  HEENT:  Normocephalic . Face symmetric, atraumatic. Nose quite congested, sinuses no TTP TMs: Normal Throat symmetric. No red, no discharge Lungs:  Prolonged expiratory time, + rhonchi, few end expiratory wheezing Normal respiratory effort, no intercostal retractions, no accessory muscle use. Heart: RRR,  no murmur.  No pretibial edema bilaterally  Skin: Not pale. Not jaundice Neurologic:  alert & oriented X3.  Speech normal, gait appropriate for age and unassisted Psych--  Cognition and judgment appear intact.  Cooperative with normal attention span and concentration.  Behavior appropriate. No anxious or depressed appearing.      Assessment & Plan:   Asthma exacerbation: Mild improvement but is still symptomatic with cough. Chest x-ray today with no pneumonia. Plan: Increased dose of prednisone, see new prescription, finish abx, add Symbicort, continue albuterol, Mucinex. See instructions. RTC one month to see PCP to see if she needs to continue Symbicort long-term.

## 2015-05-16 NOTE — Telephone Encounter (Signed)
Please advise      KP 

## 2015-05-16 NOTE — Telephone Encounter (Signed)
Caller name: Self  Can be reached: [phone]   Reason for call: Saw Dr. Etter Sjogren on Friday and was instructed to call back if she was not getting better. States she is feeling some better but still has a deep chest cough w/congestion. Plse adv if she needs to come in or go have a Chest x-ray done.

## 2015-05-16 NOTE — Patient Instructions (Signed)
Rest, fluids , tylenol  For cough:  Take Mucinex DM twice a day as needed until better  For nasal congestion: Use OTC Nasocort or Flonase : 2 nasal sprays on each side of the nose in the morning until you feel better   Start Symbicort 2 puffs twice a day You still can use albuterol as a rescue inhaler   Finish the Z-Pak  Start a new prednisone package  Call if not gradually better over the next  10 days  Call anytime if the symptoms are severe  See your primary doctor in one month.

## 2015-05-16 NOTE — Telephone Encounter (Signed)
Patient has been made aware to come into Radiology and have her CXR done, she verbalized understanding.     KP

## 2015-05-16 NOTE — Telephone Encounter (Signed)
The patient agreed to come in and have the CXR done.    KP

## 2015-05-22 ENCOUNTER — Telehealth: Payer: Self-pay | Admitting: *Deleted

## 2015-05-22 NOTE — Telephone Encounter (Signed)
Received fax from [place] requesting PA on Symbicort HFA; Initiated Cover My Meds; awaiting response/SLS 01/16

## 2015-05-23 NOTE — Telephone Encounter (Signed)
Received fax from [place] with Approval for Symbicort HFA, valid 05/23/15 through 05/22/16; faxed to pharmacy/SLS 01/17

## 2015-05-23 NOTE — Telephone Encounter (Signed)
Received Medical Necessity Criteria [for Non Covered Drugs], completed and faxed to [place]; awaiting decision/SLS

## 2015-07-25 ENCOUNTER — Ambulatory Visit: Payer: BC Managed Care – PPO | Admitting: Family Medicine

## 2015-07-25 ENCOUNTER — Encounter: Payer: Self-pay | Admitting: Family

## 2015-07-25 ENCOUNTER — Ambulatory Visit (INDEPENDENT_AMBULATORY_CARE_PROVIDER_SITE_OTHER): Payer: BC Managed Care – PPO | Admitting: Family

## 2015-07-25 VITALS — BP 112/70 | HR 74 | Temp 98.5°F | Resp 24 | Ht 67.0 in | Wt 278.4 lb

## 2015-07-25 DIAGNOSIS — H6692 Otitis media, unspecified, left ear: Secondary | ICD-10-CM | POA: Diagnosis not present

## 2015-07-25 MED ORDER — AMOXICILLIN 500 MG PO CAPS: 500.0000 mg | ORAL_CAPSULE | Freq: Three times a day (TID) | ORAL | Status: DC

## 2015-07-25 MED ORDER — BENZONATATE 100 MG PO CAPS: 100.0000 mg | ORAL_CAPSULE | Freq: Three times a day (TID) | ORAL | Status: DC | PRN

## 2015-07-25 MED FILL — BENZONATATE 100 MG CAPSULE: 100 | 6 days supply | Qty: 20 | Fill #0

## 2015-07-25 MED FILL — AMOXICILLIN 500 MG CAPSULE: 500 | 10 days supply | Qty: 30 | Fill #0

## 2015-07-25 NOTE — Progress Notes (Signed)
Pre visit review using our clinic review tool, if applicable. No additional management support is needed unless otherwise documented below in the visit note. 

## 2015-07-25 NOTE — Patient Instructions (Signed)
Start amoxicillin for ear infection. You may use tessalon as needed for cough. Please call if you develop fever greater than 101, if symptoms worsen or if symptoms are not improved in 3 days.

## 2015-07-25 NOTE — Progress Notes (Signed)
Subjective:    Patient ID: Mary Knox, female    DOB: 1980-10-06, 35 y.o.   MRN: [id number]  HPI  Ms. Mary Knox is a 35 yr old female who presents today with chief complaint of cough.  She reports that her cough began   Thursday had anorexia. Friday afternoon vomited at [facility], then developed fever.  Sunday began to cough, productive of thick yellow green sputum. Has used mucinex max last night around 3AM with brief improvement in her symptoms. Tmax 100.2.  Denies myalgia.  + left ear pain and fatigue. She is tolerating PO's currently.    Review of Systems   see HPI  Past Medical History  Diagnosis Date  . Asthma   . Bipolar disorder ([place])   . Depression     Social History   Social History  . Marital Status: Married    Spouse Name: N/A  . Number of Children: N/A  . Years of Education: N/A   Occupational History  . Not on file.   Social History Main Topics  . Smoking status: Never Smoker   . Smokeless tobacco: Never Used  . Alcohol Use: Yes     Comment:  rarely  . Drug Use: No  . Sexual Activity: Not on file   Other Topics Concern  . Not on file   Social History Narrative    Past Surgical History  Procedure Laterality Date  . Wisdom tooth extraction      Family History  Problem Relation Age of Onset  . Alcohol abuse Father   . Diabetes Paternal Grandfather   . Hypertension    . Stroke Paternal Grandfather   . Bipolar disorder Father     also OCD  . Parkinsonism    . Hypothyroidism Mother     No Known Allergies  Current Outpatient Prescriptions on File Prior to Visit  Medication Sig Dispense Refill  . albuterol (PROVENTIL HFA;VENTOLIN HFA) 108 (90 BASE) MCG/ACT inhaler Inhale 2 puffs into the lungs as needed for wheezing or shortness of breath.    . ARIPiprazole (ABILIFY) 10 MG tablet Take 10 mg by mouth daily.    . Cholecalciferol (VITAMIN D) 2000 UNITS CAPS Take by mouth.    . lamoTRIgine (LAMICTAL) 200 MG tablet Take 200 mg by mouth daily.    Marland Kitchen  levothyroxine (SYNTHROID, LEVOTHROID) 25 MCG tablet Take 25 mcg by mouth daily before breakfast.      No current facility-administered medications on file prior to visit.    BP 112/70 mmHg  Pulse 74  Temp(Src) 98.5 F (36.9 C) (Oral)  Resp 24  Ht 5\' 7"  (1.702 m)  Wt 278 lb 6.4 oz (126.281 kg)  BMI 43.59 kg/m2  SpO2 98%  LMP 07/18/2015    Objective:   Physical Exam  Constitutional: She appears well-developed and well-nourished.  HENT:  Right Ear: Tympanic membrane and ear canal normal.  Left Ear: Tympanic membrane is erythematous. Tympanic membrane is not retracted.  Mouth/Throat: No oropharyngeal exudate, posterior oropharyngeal edema or posterior oropharyngeal erythema.  Cardiovascular: Normal rate, regular rhythm and normal heart sounds.   No murmur heard. Pulmonary/Chest: Effort normal and breath sounds normal. No respiratory distress. She has no wheezes.  Psychiatric: She has a normal mood and affect. Her behavior is normal. Judgment and thought content normal.          Assessment & Plan:  L otitis media- suspect initial symptoms were viral, but now appears to be developing a left OM. Will rx with amoxicillin.  Advised pt as follows:  Start amoxicillin for ear infection. You may use tessalon as needed for cough. Please call if you develop fever greater than 101, if symptoms worsen or if symptoms are not improved in 3 days.

## 2015-08-04 ENCOUNTER — Encounter: Payer: Self-pay | Admitting: Family Medicine

## 2015-08-04 ENCOUNTER — Ambulatory Visit (INDEPENDENT_AMBULATORY_CARE_PROVIDER_SITE_OTHER): Payer: BC Managed Care – PPO | Admitting: Family Medicine

## 2015-08-04 VITALS — BP 115/76 | HR 81 | Temp 98.2°F | Wt 276.6 lb

## 2015-08-04 DIAGNOSIS — H6502 Acute serous otitis media, left ear: Secondary | ICD-10-CM | POA: Diagnosis not present

## 2015-08-04 DIAGNOSIS — H6505 Acute serous otitis media, recurrent, left ear: Secondary | ICD-10-CM | POA: Diagnosis not present

## 2015-08-04 MED ORDER — FLUTICASONE PROPIONATE 50 MCG/ACT NA SUSP: 2.0000 | Freq: Every day | NASAL | Status: DC

## 2015-08-04 NOTE — Progress Notes (Signed)
Pre visit review using our clinic review tool, if applicable. No additional management support is needed unless otherwise documented below in the visit note. 

## 2015-08-04 NOTE — Progress Notes (Signed)
Patient ID: Mary Knox, female    DOB: 1981-04-19  Age: 35 y.o. MRN: [id number]    Subjective:  Subjective HPI Mary Knox presents for f/u L ear pain.  She was see by NP and dx with otits media and treated with abx but is no better.  Ears feel clogged and need to pop.    Review of Systems  Constitutional: Negative for diaphoresis, appetite change, fatigue and unexpected weight change.  HENT: Positive for ear pain. Negative for congestion, dental problem, drooling, ear discharge, facial swelling, hearing loss, mouth sores, nosebleeds, postnasal drip, rhinorrhea, sinus pressure, sneezing, sore throat, tinnitus, trouble swallowing and voice change.   Eyes: Negative for pain, redness and visual disturbance.  Respiratory: Negative for cough, chest tightness, shortness of breath and wheezing.   Cardiovascular: Negative for chest pain, palpitations and leg swelling.  Endocrine: Negative for cold intolerance, heat intolerance, polydipsia, polyphagia and polyuria.  Genitourinary: Negative for dysuria, frequency and difficulty urinating.  Neurological: Negative for dizziness, light-headedness, numbness and headaches.    History Past Medical History  Diagnosis Date  . Asthma   . Bipolar disorder ([place])   . Depression     She has past surgical history that includes Wisdom tooth extraction.   Her family history includes Alcohol abuse in her father; Bipolar disorder in her father; Diabetes in her paternal grandfather; Hypothyroidism in her mother; Stroke in her paternal grandfather.She reports that she has never smoked. She has never used smokeless tobacco. She reports that she drinks alcohol. She reports that she does not use illicit drugs.  Current Outpatient Prescriptions on File Prior to Visit  Medication Sig Dispense Refill  . albuterol (PROVENTIL HFA;VENTOLIN HFA) 108 (90 BASE) MCG/ACT inhaler Inhale 2 puffs into the lungs as needed for wheezing or shortness of breath.    Marland Kitchen  amoxicillin (AMOXIL) 500 MG capsule Take 1 capsule (500 mg total) by mouth 3 (three) times daily. 30 capsule 0  . ARIPiprazole (ABILIFY) 10 MG tablet Take 10 mg by mouth daily.    . benzonatate (TESSALON) 100 MG capsule Take 1 capsule (100 mg total) by mouth 3 (three) times daily as needed. 20 capsule 0  . Cholecalciferol (VITAMIN D) 2000 UNITS CAPS Take by mouth.    . lamoTRIgine (LAMICTAL) 200 MG tablet Take 200 mg by mouth daily.    Marland Kitchen levothyroxine (SYNTHROID, LEVOTHROID) 25 MCG tablet Take 25 mcg by mouth daily before breakfast.      No current facility-administered medications on file prior to visit.     Objective:  Objective Physical Exam  Constitutional: She is oriented to person, place, and time. She appears well-developed and well-nourished.  HENT:  Head: Normocephalic and atraumatic.  Right Ear: Hearing, tympanic membrane, external ear and ear canal normal.  Left Ear: There is tenderness. Tympanic membrane mobility is abnormal. A middle ear effusion is present.  Eyes: Conjunctivae and EOM are normal.  Neck: Normal range of motion. Neck supple. No JVD present. Carotid bruit is not present. No thyromegaly present.  Cardiovascular: Normal rate, regular rhythm and normal heart sounds.   No murmur heard. Pulmonary/Chest: Effort normal and breath sounds normal. No respiratory distress. She has no wheezes. She has no rales. She exhibits no tenderness.  Musculoskeletal: She exhibits no edema.  Neurological: She is alert and oriented to person, place, and time.  Psychiatric: She has a normal mood and affect.  Nursing note and vitals reviewed.  BP 115/76 mmHg  Pulse 81  Temp(Src) 98.2 F (36.8 C) (  Oral)  Wt 276 lb 9.6 oz (125.465 kg)  SpO2 98%  LMP 07/18/2015 Wt Readings from Last 3 Encounters:  08/04/15 276 lb 9.6 oz (125.465 kg)  07/25/15 278 lb 6.4 oz (126.281 kg)  05/16/15 275 lb 8 oz (124.966 kg)     Lab Results  Component Value Date   WBC 13.0* 11/12/2012   HGB 14.6  11/12/2012   HCT 43.4 11/12/2012   PLT 334.0 11/12/2012   GLUCOSE 119* 11/12/2012   CHOL 212* 03/23/2009   TRIG 166.0* 03/23/2009   HDL 45.30 03/23/2009   LDLDIRECT 145.7 03/23/2009   ALT 38* 11/12/2012   AST 25 11/12/2012   NA 138 11/12/2012   K 3.6 11/12/2012   CL 105 11/12/2012   CREATININE 1.0 11/12/2012   BUN 11 11/12/2012   CO2 26 11/12/2012   TSH 1.81 11/12/2012   HGBA1C 5.6 11/12/2012    Dg Chest 2 View  05/16/2015  CLINICAL DATA:  Cough for 5 days.  On antibiotics. EXAM: CHEST  2 VIEW COMPARISON:  None FINDINGS: The heart size and mediastinal contours are within normal limits. Scar like density is noted in the left midlung. The visualized skeletal structures are unremarkable. IMPRESSION: No active cardiopulmonary disease. Electronically Signed   By: Kerby Moors M.D.   On: 05/16/2015 14:42     Assessment & Plan:  Plan I am having Mary Knox start on fluticasone. I am also having her maintain her ARIPiprazole, lamoTRIgine, levothyroxine, Vitamin D, albuterol, benzonatate, and amoxicillin.  Meds ordered this encounter  Medications  . fluticasone (FLONASE) 50 MCG/ACT nasal spray    Sig: Place 2 sprays into both nostrils daily.    Dispense:  16 g    Refill:  6    Problem List Items Addressed This Visit      Unprioritized   Acute serous otitis media of left ear - Primary    flonase Antihistamine with decongestant for no more than 2 weeks Call or rto if no better      Relevant Medications   fluticasone (FLONASE) 50 MCG/ACT nasal spray      Follow-up: Return if symptoms worsen or fail to improve.  Ann Held, DO

## 2015-08-04 NOTE — Patient Instructions (Signed)
Serous Otitis Media Serous otitis media is fluid in the middle ear space. This space contains the bones for hearing and air. Air in the middle ear space helps to transmit sound.  The air gets there through the eustachian tube. This tube goes from the back of the nose (nasopharynx) to the middle ear space. It keeps the pressure in the middle ear the same as the outside world. It also helps to drain fluid from the middle ear space. CAUSES  Serous otitis media occurs when the eustachian tube gets blocked. Blockage can come from:  Ear infections.  Colds and other upper respiratory infections.  Allergies.  Irritants such as cigarette smoke.  Sudden changes in air pressure (such as descending in an airplane).  Enlarged adenoids.  A mass in the nasopharynx. During colds and upper respiratory infections, the middle ear space can become temporarily filled with fluid. This can happen after an ear infection also. Once the infection clears, the fluid will generally drain out of the ear through the eustachian tube. If it does not, then serous otitis media occurs. SIGNS AND SYMPTOMS   Hearing loss.  A feeling of fullness in the ear, without pain.  Young children may not show any symptoms but may show slight behavioral changes, such as agitation, ear pulling, or crying. DIAGNOSIS  Serous otitis media is diagnosed by an ear exam. Tests may be done to check on the movement of the eardrum. Hearing exams may also be done. TREATMENT  The fluid most often goes away without treatment. If allergy is the cause, allergy treatment may be helpful. Fluid that persists for several months may require minor surgery. A small tube is placed in the eardrum to:  Drain the fluid.  Restore the air in the middle ear space. In certain situations, antibiotic medicines are used to avoid surgery. Surgery may be done to remove enlarged adenoids (if this is the cause). HOME CARE INSTRUCTIONS   Keep children away from  tobacco smoke.  Keep all follow-up visits as directed by your health care provider. SEEK MEDICAL CARE IF:   Your hearing is not better in 3 months.  Your hearing is worse.  You have ear pain.  You have drainage from the ear.  You have dizziness.  You have serous otitis media only in one ear or have any bleeding from your nose (epistaxis).  You notice a lump on your neck. MAKE SURE YOU:  Understand these instructions.   Will watch your condition.   Will get help right away if you are not doing well or get worse.    This information is not intended to replace advice given to you by your health care provider. Make sure you discuss any questions you have with your health care provider.   Document Released: 07/13/2003 Document Revised: 05/13/2014 Document Reviewed: 11/17/2012 Elsevier Interactive Patient Education 2016 [employer] Inc.  

## 2015-08-05 DIAGNOSIS — H6502 Acute serous otitis media, left ear: Secondary | ICD-10-CM | POA: Insufficient documentation

## 2015-08-05 NOTE — Assessment & Plan Note (Signed)
flonase Antihistamine with decongestant for no more than 2 weeks Call or rto if no better

## 2015-10-31 ENCOUNTER — Ambulatory Visit (INDEPENDENT_AMBULATORY_CARE_PROVIDER_SITE_OTHER): Payer: BC Managed Care – PPO | Admitting: Family Medicine

## 2015-10-31 ENCOUNTER — Encounter: Payer: Self-pay | Admitting: Family Medicine

## 2015-10-31 VITALS — BP 114/76 | HR 75 | Temp 98.9°F | Wt 270.0 lb

## 2015-10-31 DIAGNOSIS — J069 Acute upper respiratory infection, unspecified: Secondary | ICD-10-CM

## 2015-10-31 MED ORDER — LEVOCETIRIZINE DIHYDROCHLORIDE 5 MG PO TABS: 5.0000 mg | ORAL_TABLET | Freq: Every evening | ORAL | Status: DC

## 2015-10-31 MED ORDER — FLUTICASONE PROPIONATE 50 MCG/ACT NA SUSP: 2.0000 | Freq: Every day | NASAL | Status: DC

## 2015-10-31 NOTE — Progress Notes (Signed)
Pre visit review using our clinic review tool, if applicable. No additional management support is needed unless otherwise documented below in the visit note. 

## 2015-10-31 NOTE — Progress Notes (Signed)
  Subjective:     Mary Knox is a 35 y.o. female who presents for evaluation of symptoms of a URI. Symptoms include congestion, nasal congestion, no  fever, post nasal drip and productive cough with  clear colored sputum. Onset of symptoms was 4 weeks ago, and has been gradually worsening since that time. Treatment to date: none.  The following portions of the patient's history were reviewed and updated as appropriate:  She  has a past medical history of Asthma; Bipolar disorder ([place]); and Depression. She  does not have any pertinent problems on file. She  has past surgical history that includes Wisdom tooth extraction. Her family history includes Alcohol abuse in her father; Bipolar disorder in her father; Diabetes in her paternal grandfather; Hypothyroidism in her mother; Stroke in her paternal grandfather. She  reports that she has never smoked. She has never used smokeless tobacco. She reports that she drinks alcohol. She reports that she does not use illicit drugs. She has a current medication list which includes the following prescription(s): albuterol, aripiprazole, vitamin d, lamotrigine, and levothyroxine. Current Outpatient Prescriptions on File Prior to Visit  Medication Sig Dispense Refill  . albuterol (PROVENTIL HFA;VENTOLIN HFA) 108 (90 BASE) MCG/ACT inhaler Inhale 2 puffs into the lungs as needed for wheezing or shortness of breath.    . ARIPiprazole (ABILIFY) 10 MG tablet Take 10 mg by mouth daily.    . Cholecalciferol (VITAMIN D) 2000 UNITS CAPS Take by mouth.    . lamoTRIgine (LAMICTAL) 200 MG tablet Take 200 mg by mouth daily.    Marland Kitchen levothyroxine (SYNTHROID, LEVOTHROID) 25 MCG tablet Take 25 mcg by mouth daily before breakfast.      No current facility-administered medications on file prior to visit.   She has No Known Allergies..  Review of Systems Pertinent items are noted in HPI.   Objective:    BP 114/76 mmHg  Pulse 75  Temp(Src) 98.9 F (37.2 C) (Oral)  Wt  270 lb (122.471 kg)  SpO2 100%  LMP 10/24/2015 General appearance: alert, cooperative, appears stated age and no distress Ears: normal TM's and external ear canals both ears Nose: clear discharge, moderate congestion, turbinates red, swollen Throat: abnormal findings: mild oropharyngeal erythema Neck: no adenopathy, supple, symmetrical, trachea midline and thyroid not enlarged, symmetric, no tenderness/mass/nodules Lungs: clear to auscultation bilaterally Heart: S1, S2 normal   Assessment:    viral upper respiratory illness   Plan:    Discussed diagnosis and treatment of URI. Suggested symptomatic OTC remedies. Nasal saline spray for congestion. Follow up as needed.

## 2015-10-31 NOTE — Patient Instructions (Signed)
Upper Respiratory Infection, Adult Most upper respiratory infections (URIs) are a viral infection of the air passages leading to the lungs. A URI affects the nose, throat, and upper air passages. The most common type of URI is nasopharyngitis and is typically referred to as "the common cold." URIs run their course and usually go away on their own. Most of the time, a URI does not require medical attention, but sometimes a bacterial infection in the upper airways can follow a viral infection. This is called a secondary infection. Sinus and middle ear infections are common types of secondary upper respiratory infections. Bacterial pneumonia can also complicate a URI. A URI can worsen asthma and chronic obstructive pulmonary disease (COPD). Sometimes, these complications can require emergency medical care and may be life threatening.  CAUSES Almost all URIs are caused by viruses. A virus is a type of germ and can spread from one person to another.  RISKS FACTORS You may be at risk for a URI if:   You smoke.   You have chronic heart or lung disease.  You have a weakened defense (immune) system.   You are very young or very old.   You have nasal allergies or asthma.  You work in crowded or poorly ventilated areas.  You work in health care facilities or schools. SIGNS AND SYMPTOMS  Symptoms typically develop 2-3 days after you come in contact with a cold virus. Most viral URIs last 7-10 days. However, viral URIs from the influenza virus (flu virus) can last 14-18 days and are typically more severe. Symptoms may include:   Runny or stuffy (congested) nose.   Sneezing.   Cough.   Sore throat.   Headache.   Fatigue.   Fever.   Loss of appetite.   Pain in your forehead, behind your eyes, and over your cheekbones (sinus pain).  Muscle aches.  DIAGNOSIS  Your health care provider may diagnose a URI by:  Physical exam.  Tests to check that your symptoms are not due to  another condition such as:  Strep throat.  Sinusitis.  Pneumonia.  Asthma. TREATMENT  A URI goes away on its own with time. It cannot be cured with medicines, but medicines may be prescribed or recommended to relieve symptoms. Medicines may help:  Reduce your fever.  Reduce your cough.  Relieve nasal congestion. HOME CARE INSTRUCTIONS   Take medicines only as directed by your health care provider.   Gargle warm saltwater or take cough drops to comfort your throat as directed by your health care provider.  Use a warm mist humidifier or inhale steam from a shower to increase air moisture. This may make it easier to breathe.  Drink enough fluid to keep your urine clear or pale yellow.   Eat soups and other clear broths and maintain good nutrition.   Rest as needed.   Return to work when your temperature has returned to normal or as your health care provider advises. You may need to stay home longer to avoid infecting others. You can also use a face mask and careful hand washing to prevent spread of the virus.  Increase the usage of your inhaler if you have asthma.   Do not use any tobacco products, including cigarettes, chewing tobacco, or electronic cigarettes. If you need help quitting, ask your health care provider. PREVENTION  The best way to protect yourself from getting a cold is to practice good hygiene.   Avoid oral or hand contact with people with cold   symptoms.   Wash your hands often if contact occurs.  There is no clear evidence that vitamin C, vitamin E, echinacea, or exercise reduces the chance of developing a cold. However, it is always recommended to get plenty of rest, exercise, and practice good nutrition.  SEEK MEDICAL CARE IF:   You are getting worse rather than better.   Your symptoms are not controlled by medicine.   You have chills.  You have worsening shortness of breath.  You have brown or red mucus.  You have yellow or brown nasal  discharge.  You have pain in your face, especially when you bend forward.  You have a fever.  You have swollen neck glands.  You have pain while swallowing.  You have white areas in the back of your throat. SEEK IMMEDIATE MEDICAL CARE IF:   You have severe or persistent:  Headache.  Ear pain.  Sinus pain.  Chest pain.  You have chronic lung disease and any of the following:  Wheezing.  Prolonged cough.  Coughing up blood.  A change in your usual mucus.  You have a stiff neck.  You have changes in your:  Vision.  Hearing.  Thinking.  Mood. MAKE SURE YOU:   Understand these instructions.  Will watch your condition.  Will get help right away if you are not doing well or get worse.   This information is not intended to replace advice given to you by your health care provider. Make sure you discuss any questions you have with your health care provider.   Document Released: 10/16/2000 Document Revised: 09/06/2014 Document Reviewed: 07/28/2013 Elsevier Interactive Patient Education 2016 Elsevier Inc.  

## 2015-12-19 ENCOUNTER — Encounter: Payer: Self-pay | Admitting: Family Medicine

## 2015-12-19 ENCOUNTER — Ambulatory Visit (INDEPENDENT_AMBULATORY_CARE_PROVIDER_SITE_OTHER): Payer: BC Managed Care – PPO | Admitting: Family Medicine

## 2015-12-19 VITALS — BP 132/74 | HR 78 | Temp 99.8°F | Wt 276.0 lb

## 2015-12-19 DIAGNOSIS — D2230 Melanocytic nevi of unspecified part of face: Secondary | ICD-10-CM | POA: Diagnosis not present

## 2015-12-19 NOTE — Patient Instructions (Signed)
Seborrheic Keratosis Seborrheic keratosis is a common, noncancerous (benign) skin growth. This condition causes waxy, rough, tan, brown, or black spots to appear on the skin. These skin growths can be flat or raised. CAUSES The cause of this condition is not known. RISK FACTORS This condition is more likely to develop in:  People who have a family history of seborrheic keratosis.  People who are 50 or older.  People who are pregnant.  People who have had estrogen replacement therapy. SYMPTOMS This condition often occurs on the face, chest, shoulders, back, or other areas. These growths:  Are usually painless, but may become irritated and itchy.  Can be yellow, brown, black, or other colors.  Are slightly raised or have a flat surface.  Are sometimes rough or wart-like in texture.  Are often waxy on the surface.  Are round or oval-shaped.  Sometimes look like they are "stuck on."  Often occur in groups, but may occur as a single growth. DIAGNOSIS This condition is diagnosed with a medical history and physical exam. A sample of the growth may be tested (skin biopsy). You may need to see a skin specialist (dermatologist). TREATMENT Treatment is not usually needed for this condition, unless the growths are irritated or are often bleeding. You may also choose to have the growths removed if you do not like their appearance. Most commonly, these growths are treated with a procedure in which liquid nitrogen is applied to "freeze" off the growth (cryosurgery). They may also be burned off with electricity or cut off. HOME CARE INSTRUCTIONS  Watch your growth for any changes.  Keep all follow-up visits as told by your health care provider. This is important.  Do not scratch or pick at the growth or growths. This can cause them to become irritated or infected. SEEK MEDICAL CARE IF:  You suddenly have many new growths.  Your growth bleeds, itches, or hurts.  Your growth suddenly  becomes larger or changes color.   This information is not intended to replace advice given to you by your health care provider. Make sure you discuss any questions you have with your health care provider.   Document Released: 05/25/2010 Document Revised: 01/11/2015 Document Reviewed: 09/07/2014 Elsevier Interactive Patient Education 2016 Elsevier Inc.  

## 2015-12-19 NOTE — Progress Notes (Signed)
Patient ID: Mary Knox, female    DOB: 1980/09/17  Age: 35 y.o. MRN: [id number]    Subjective:  Subjective  HPI Mary Knox presents to request a refer to a derm for papules on forehead--- areas will scratch off and come back.    Review of Systems  Constitutional: Negative for appetite change, diaphoresis, fatigue and unexpected weight change.  Eyes: Negative for pain, redness and visual disturbance.  Respiratory: Negative for cough, chest tightness, shortness of breath and wheezing.   Cardiovascular: Negative for chest pain, palpitations and leg swelling.  Endocrine: Negative for cold intolerance, heat intolerance, polydipsia, polyphagia and polyuria.  Genitourinary: Negative for difficulty urinating, dysuria and frequency.  Skin:       Papules on forehead and L cheek  Neurological: Negative for dizziness, light-headedness, numbness and headaches.    History Past Medical History:  Diagnosis Date  . Asthma   . Bipolar disorder ([place])   . Depression     She has a past surgical history that includes Wisdom tooth extraction.   Her family history includes Alcohol abuse in her father; Bipolar disorder in her father; Diabetes in her paternal grandfather; Hypothyroidism in her mother; Stroke in her paternal grandfather.She reports that she has never smoked. She has never used smokeless tobacco. She reports that she drinks alcohol. She reports that she does not use drugs.  Current Outpatient Prescriptions on File Prior to Visit  Medication Sig Dispense Refill  . albuterol (PROVENTIL HFA;VENTOLIN HFA) 108 (90 BASE) MCG/ACT inhaler Inhale 2 puffs into the lungs as needed for wheezing or shortness of breath.    . ARIPiprazole (ABILIFY) 10 MG tablet Take 10 mg by mouth daily.    . Cholecalciferol (VITAMIN D) 2000 UNITS CAPS Take by mouth.    . lamoTRIgine (LAMICTAL) 200 MG tablet Take 200 mg by mouth daily.    Marland Kitchen levothyroxine (SYNTHROID, LEVOTHROID) 25 MCG tablet Take 25 mcg by mouth  daily before breakfast.      No current facility-administered medications on file prior to visit.      Objective:  Objective  Physical Exam  Constitutional: She appears well-developed and well-nourished.  HENT:  Head:    Nursing note and vitals reviewed.  BP 132/74 (BP Location: Left Arm, Patient Position: Sitting, Cuff Size: Large)   Pulse 78   Temp 99.8 F (37.7 C) (Oral)   Wt 276 lb (125.2 kg)   LMP 12/05/2015   SpO2 98%   BMI 43.23 kg/m  Wt Readings from Last 3 Encounters:  12/19/15 276 lb (125.2 kg)  10/31/15 270 lb (122.5 kg)  08/04/15 276 lb 9.6 oz (125.5 kg)     Lab Results  Component Value Date   WBC 13.0 (H) 11/12/2012   HGB 14.6 11/12/2012   HCT 43.4 11/12/2012   PLT 334.0 11/12/2012   GLUCOSE 119 (H) 11/12/2012   CHOL 212 (H) 03/23/2009   TRIG 166.0 (H) 03/23/2009   HDL 45.30 03/23/2009   LDLDIRECT 145.7 03/23/2009   ALT 38 (H) 11/12/2012   AST 25 11/12/2012   NA 138 11/12/2012   K 3.6 11/12/2012   CL 105 11/12/2012   CREATININE 1.0 11/12/2012   BUN 11 11/12/2012   CO2 26 11/12/2012   TSH 1.81 11/12/2012   HGBA1C 5.6 11/12/2012    Dg Chest 2 View  Result Date: 05/16/2015 CLINICAL DATA:  Cough for 5 days.  On antibiotics. EXAM: CHEST  2 VIEW COMPARISON:  None FINDINGS: The heart size and mediastinal contours are within  normal limits. Scar like density is noted in the left midlung. The visualized skeletal structures are unremarkable. IMPRESSION: No active cardiopulmonary disease. Electronically Signed   By: Kerby Moors M.D.   On: 05/16/2015 14:42     Assessment & Plan:  Plan  I have discontinued Mary Knox's levocetirizine and fluticasone. I am also having her maintain her ARIPiprazole, lamoTRIgine, levothyroxine, Vitamin D, and albuterol.  No orders of the defined types were placed in this encounter.   Problem List Items Addressed This Visit    None    Visit Diagnoses    Recurrent nevus of face    -  Primary   Relevant Orders    Ambulatory referral to Dermatology      Follow-up: No Follow-up on file.  Ann Held, DO

## 2015-12-19 NOTE — Progress Notes (Signed)
Pre visit review using our clinic review tool, if applicable. No additional management support is needed unless otherwise documented below in the visit note. 

## 2015-12-21 ENCOUNTER — Ambulatory Visit: Payer: BC Managed Care – PPO | Admitting: Medical

## 2015-12-22 ENCOUNTER — Ambulatory Visit (INDEPENDENT_AMBULATORY_CARE_PROVIDER_SITE_OTHER): Payer: BC Managed Care – PPO | Admitting: Medical

## 2015-12-22 VITALS — BP 135/83

## 2015-12-22 DIAGNOSIS — N390 Urinary tract infection, site not specified: Secondary | ICD-10-CM | POA: Diagnosis not present

## 2015-12-22 DIAGNOSIS — R82998 Other abnormal findings in urine: Secondary | ICD-10-CM

## 2015-12-22 DIAGNOSIS — M545 Low back pain, unspecified: Secondary | ICD-10-CM

## 2015-12-22 DIAGNOSIS — R300 Dysuria: Secondary | ICD-10-CM | POA: Diagnosis not present

## 2015-12-22 DIAGNOSIS — N926 Irregular menstruation, unspecified: Secondary | ICD-10-CM | POA: Diagnosis not present

## 2015-12-22 LAB — POCT URINALYSIS DIPSTICK
Bilirubin, UA: NEGATIVE
Glucose, UA: NEGATIVE
Ketones, UA: NEGATIVE
Nitrite, UA: NEGATIVE
Spec Grav, UA: 1.01
Urobilinogen, UA: 4
pH, UA: 7.5

## 2015-12-22 NOTE — Progress Notes (Signed)
Pre visit review using our clinic tool,if applicable. No additional management support is needed unless otherwise documented below in the visit note.  

## 2015-12-22 NOTE — Patient Instructions (Addendum)
It appears you may have had some mild sciatica and pain now resolved. If pain returns let us know.(Follow conservative measures as discussed if returns and ibuprofen otc.)  For 2 days of recent pain urinating mid week  so will do urine culture and check for bv. If symptoms reoccur and studies negative would need to expand to other test. No treatment needed presently.  You report normal menses now. In future if abnormal irregular cycles then would consider [place] to evaluate if any uterine fibroids.  Follow up date to be determined after labs back or as needed   Sciatica Sciatica is pain, weakness, numbness, or tingling along the path of the sciatic nerve. The nerve starts in the lower back and runs down the back of each leg. The nerve controls the muscles in the lower leg and in the back of the knee, while also providing sensation to the back of the thigh, lower leg, and the sole of your foot. Sciatica is a symptom of another medical condition. For instance, nerve damage or certain conditions, such as a herniated disk or bone spur on the spine, pinch or put pressure on the sciatic nerve. This causes the pain, weakness, or other sensations normally associated with sciatica. Generally, sciatica only affects one side of the body. CAUSES   Herniated or slipped disc.  Degenerative disk disease.  A pain disorder involving the narrow muscle in the buttocks (piriformis syndrome).  Pelvic injury or fracture.  Pregnancy.  Tumor (rare). SYMPTOMS  Symptoms can vary from mild to very severe. The symptoms usually travel from the low back to the buttocks and down the back of the leg. Symptoms can include:  Mild tingling or dull aches in the lower back, leg, or hip.  Numbness in the back of the calf or sole of the foot.  Burning sensations in the lower back, leg, or hip.  Sharp pains in the lower back, leg, or hip.  Leg weakness.  Severe back pain inhibiting movement. These symptoms may get worse  with coughing, sneezing, laughing, or prolonged sitting or standing. Also, being overweight may worsen symptoms. DIAGNOSIS  Your caregiver will perform a physical exam to look for common symptoms of sciatica. He or she may ask you to do certain movements or activities that would trigger sciatic nerve pain. Other tests may be performed to find the cause of the sciatica. These may include:  Blood tests.  X-rays.  Imaging tests, such as an MRI or CT scan. TREATMENT  Treatment is directed at the cause of the sciatic pain. Sometimes, treatment is not necessary and the pain and discomfort goes away on its own. If treatment is needed, your caregiver may suggest:  Over-the-counter medicines to relieve pain.  Prescription medicines, such as anti-inflammatory medicine, muscle relaxants, or narcotics.  Applying heat or ice to the painful area.  Steroid injections to lessen pain, irritation, and inflammation around the nerve.  Reducing activity during periods of pain.  Exercising and stretching to strengthen your abdomen and improve flexibility of your spine. Your caregiver may suggest losing weight if the extra weight makes the back pain worse.  Physical therapy.  Surgery to eliminate what is pressing or pinching the nerve, such as a bone spur or part of a herniated disk. HOME CARE INSTRUCTIONS   Only take over-the-counter or prescription medicines for pain or discomfort as directed by your caregiver.  Apply ice to the affected area for 20 minutes, 3-4 times a day for the first 48-72 hours. Then  try heat in the same way.  Exercise, stretch, or perform your usual activities if these do not aggravate your pain.  Attend physical therapy sessions as directed by your caregiver.  Keep all follow-up appointments as directed by your caregiver.  Do not wear high heels or shoes that do not provide proper support.  Check your mattress to see if it is too soft. A firm mattress may lessen your pain  and discomfort. SEEK IMMEDIATE MEDICAL CARE IF:   You lose control of your bowel or bladder (incontinence).  You have increasing weakness in the lower back, pelvis, buttocks, or legs.  You have redness or swelling of your back.  You have a burning sensation when you urinate.  You have pain that gets worse when you lie down or awakens you at night.  Your pain is worse than you have experienced in the past.  Your pain is lasting longer than 4 weeks.  You are suddenly losing weight without reason. MAKE SURE YOU:  Understand these instructions.  Will watch your condition.  Will get help right away if you are not doing well or get worse.   This information is not intended to replace advice given to you by your health care provider. Make sure you discuss any questions you have with your health care provider.   Document Released: 04/16/2001 Document Revised: 01/11/2015 Document Reviewed: 09/01/2011 Elsevier Interactive Patient Education [employer].

## 2015-12-22 NOTE — Progress Notes (Signed)
Subjective:    Patient ID: Mary Knox, female    DOB: [birth year], 35 y.o.   MRN: [id number]  HPI  Pt in with some recent rt lower side back pain recently. She state pain since Sunday. Pain was most noticed at night and then early am pain would resolve. Pt did work out on Saturday. Only did ellipitcal.   Some spotting that preceding menses. Now today started normal menstrual flow today. Pt does have history of irregular menses. Usually no spotting precedinging menstrual cycle.(then clarified only spotted when urinated). Pt did note early in week couple of days in row pain urinating. But no dysuria for 2 days.   No hx of kidney stones. No history of fibroids known. But hx of ovarian cyst. Hx of pcos.     Review of Systems  Constitutional: Negative for chills, fatigue and unexpected weight change.  HENT: Negative for dental problem.   Respiratory: Negative for cough, chest tightness, shortness of breath and wheezing.   Cardiovascular: Negative for chest pain.  Genitourinary: Negative for decreased urine volume, difficulty urinating, dysuria, hematuria, urgency and vaginal pain.       Dysuria resolved now.  Musculoskeletal:       Back pain resolved now.  Neurological: Negative for dizziness, tremors, weakness and headaches.  Hematological: Negative for adenopathy. Does not bruise/bleed easily.  Psychiatric/Behavioral: Negative for behavioral problems and confusion.   Past Medical History:  Diagnosis Date  . Asthma   . Bipolar disorder ([place])   . Depression      Social History   Social History  . Marital status: Married    Spouse name: N/A  . Number of children: N/A  . Years of education: N/A   Occupational History  . Not on file.   Social History Main Topics  . Smoking status: Never Smoker  . Smokeless tobacco: Never Used  . Alcohol use Yes     Comment:  rarely  . Drug use: No  . Sexual activity: Not on file   Other Topics Concern  . Not on file   Social  History Narrative  . No narrative on file    Past Surgical History:  Procedure Laterality Date  . WISDOM TOOTH EXTRACTION      Family History  Problem Relation Age of Onset  . Alcohol abuse Father   . Diabetes Paternal Grandfather   . Hypertension    . Stroke Paternal Grandfather   . Bipolar disorder Father     also OCD  . Parkinsonism    . Hypothyroidism Mother     No Known Allergies  Current Outpatient Prescriptions on File Prior to Visit  Medication Sig Dispense Refill  . albuterol (PROVENTIL HFA;VENTOLIN HFA) 108 (90 BASE) MCG/ACT inhaler Inhale 2 puffs into the lungs as needed for wheezing or shortness of breath.    . ARIPiprazole (ABILIFY) 10 MG tablet Take 10 mg by mouth daily.    . Cholecalciferol (VITAMIN D) 2000 UNITS CAPS Take by mouth.    . lamoTRIgine (LAMICTAL) 200 MG tablet Take 200 mg by mouth daily.    Marland Kitchen levothyroxine (SYNTHROID, LEVOTHROID) 25 MCG tablet Take 25 mcg by mouth daily before breakfast.      No current facility-administered medications on file prior to visit.     BP 135/83   LMP 12/05/2015       Objective:   Physical Exam  General- No acute distress. Pleasant patient. Neck- Full range of motion, no jvd Lungs- Clear, even and unlabored. Heart-  regular rate and rhythm. Neurologic- CNII- XII grossly intact.  Back- no cva tenderness.No si tenderness presntly but pint to rt si area as region of pain when had. No mid lumbar pain on palpation.   Abdomen- soft,nt, nd, +BS.      Assessment & Plan:  It appears you may have had some mild sciatica and pain now resolved. If pain returns let us know.(Follow conservative measures as discussed if returns and ibuprofen otc.)  For 2 days of recent pain urinating mid week so will do urine culture and check for bv. If symptoms reoccur and studies negative would need to expand to other test. No treatment needed presently.  You report normal menses now. In future if abnormal irregular cycles then  would consider [place] to evaluate if any uterine fibroids.  Follow up date to be determined after labs back or as needed  Gift Rueckert, Percell Miller, [place]

## 2015-12-24 LAB — URINE CULTURE: Organism ID, Bacteria: 10000

## 2015-12-25 NOTE — Addendum Note (Signed)
Addended by: Tasia Catchings on: 12/25/2015 10:00 AM   Modules accepted: Orders

## 2016-04-02 ENCOUNTER — Ambulatory Visit: Payer: BC Managed Care – PPO | Admitting: Family Medicine

## 2016-04-03 ENCOUNTER — Encounter: Payer: Self-pay | Admitting: Medical

## 2016-04-03 ENCOUNTER — Ambulatory Visit (INDEPENDENT_AMBULATORY_CARE_PROVIDER_SITE_OTHER): Payer: BC Managed Care – PPO | Admitting: Medical

## 2016-04-03 VITALS — BP 128/84 | HR 68 | Temp 98.3°F | Wt 273.0 lb

## 2016-04-03 DIAGNOSIS — J3489 Other specified disorders of nose and nasal sinuses: Secondary | ICD-10-CM | POA: Diagnosis not present

## 2016-04-03 DIAGNOSIS — H1033 Unspecified acute conjunctivitis, bilateral: Secondary | ICD-10-CM | POA: Diagnosis not present

## 2016-04-03 DIAGNOSIS — R0981 Nasal congestion: Secondary | ICD-10-CM | POA: Diagnosis not present

## 2016-04-03 MED ORDER — AZITHROMYCIN 250 MG PO TABS: ORAL_TABLET | ORAL | 0 refills | Status: DC

## 2016-04-03 MED ORDER — FLUTICASONE PROPIONATE 50 MCG/ACT NA SUSP: 2.0000 | Freq: Every day | NASAL | 1 refills | Status: DC

## 2016-04-03 MED ORDER — TOBRAMYCIN 0.3 % OP SOLN: 2.0000 [drp] | OPHTHALMIC | 0 refills | Status: DC

## 2016-04-03 MED FILL — TOBRAMYCIN 0.3% EYE DROPS: 0.3 | 8 days supply | Qty: 5 | Fill #0

## 2016-04-03 MED FILL — FLUTICASONE PROP 50 MCG SPR: 50 | 30 days supply | Qty: 16 | Fill #0

## 2016-04-03 MED FILL — AZITHROMYCIN 250 MG TABLET: 250 | 5 days supply | Qty: 6 | Fill #0

## 2016-04-03 NOTE — Patient Instructions (Signed)
For conjunctivitis which is likely bacterial will rx tobrex. Work note today. If constant dc by tomorrow then stay out of work and we can give note.  For nasal congestion rx flonase.  If sinus pressure worsen indicating infection then start azithromycin.  Follow up in 7 days or as needed

## 2016-04-03 NOTE — Progress Notes (Signed)
Pre visit review using our clinic review tool, if applicable. No additional management support is needed unless otherwise documented below in the visit note. 

## 2016-04-03 NOTE — Progress Notes (Signed)
Subjective:    Patient ID: Mary Knox, female    DOB: 30-Dec-1980, 35 y.o.   MRN: [id number]  HPI  Pt in with both eyes red. Sunday and Monday eyes were more red. Medial aspects of eyes some mild colored discharge. 2 days ago she removed her contacts. No vision changes and no eye pain.  Pt states some mid nasal congestion. Mild sneezing.  Pt is a Pharmacist, hospital.    Review of Systems  Constitutional: Negative for chills, fatigue and fever.  HENT: Positive for congestion. Negative for nosebleeds, postnasal drip, rhinorrhea, sinus pain, sinus pressure and sore throat.   Eyes: Positive for discharge, redness and itching. Negative for photophobia and visual disturbance.  Respiratory: Negative for cough, chest tightness, shortness of breath and wheezing.   Cardiovascular: Negative for palpitations.  Skin: Negative for rash.  Neurological: Negative for dizziness, speech difficulty, numbness and headaches.  Hematological: Negative for adenopathy. Does not bruise/bleed easily.  Psychiatric/Behavioral: Negative for behavioral problems and confusion.   Past Medical History:  Diagnosis Date  . Asthma   . Bipolar disorder ([place])   . Depression      Social History   Social History  . Marital status: Married    Spouse name: N/A  . Number of children: N/A  . Years of education: N/A   Occupational History  . Not on file.   Social History Main Topics  . Smoking status: Never Smoker  . Smokeless tobacco: Never Used  . Alcohol use Yes     Comment:  rarely  . Drug use: No  . Sexual activity: Not on file   Other Topics Concern  . Not on file   Social History Narrative  . No narrative on file    Past Surgical History:  Procedure Laterality Date  . WISDOM TOOTH EXTRACTION      Family History  Problem Relation Age of Onset  . Alcohol abuse Father   . Diabetes Paternal Grandfather   . Hypertension    . Stroke Paternal Grandfather   . Bipolar disorder Father     also OCD  .  Parkinsonism    . Hypothyroidism Mother     No Known Allergies  Current Outpatient Prescriptions on File Prior to Visit  Medication Sig Dispense Refill  . albuterol (PROVENTIL HFA;VENTOLIN HFA) 108 (90 BASE) MCG/ACT inhaler Inhale 2 puffs into the lungs as needed for wheezing or shortness of breath.    . ARIPiprazole (ABILIFY) 10 MG tablet Take 10 mg by mouth daily.    . Cholecalciferol (VITAMIN D) 2000 UNITS CAPS Take by mouth.    . lamoTRIgine (LAMICTAL) 200 MG tablet Take 200 mg by mouth daily.    Marland Kitchen levothyroxine (SYNTHROID, LEVOTHROID) 25 MCG tablet Take 25 mcg by mouth daily before breakfast.      No current facility-administered medications on file prior to visit.     BP 128/84 (BP Location: Left Arm, Patient Position: Sitting, Cuff Size: Normal)   Pulse 68   Temp 98.3 F (36.8 C) (Oral)   Wt 273 lb (123.8 kg)   LMP 03/27/2016   SpO2 98%   BMI 42.76 kg/m       Objective:   Physical Exam  General  Mental Status - Alert. General Appearance - Well groomed. Not in acute distress.  Skin Rashes- No Rashes.  HEENT Head- Normal. Ear Auditory Canal - Left- Normal. Right - Normal.Tympanic Membrane- Left- Normal. Right- Normal. Eye Sclera/Conjunctiva- injected conjuncitva bilaterally.no dc presently. Nose & Sinuses Nasal  Mucosa- Left-  Boggy and Congested. Right-  Boggy and  Congested.Bilateral faint maxillary and faint  frontal sinus pressure. Mouth & Throat Lips: Upper Lip- Normal: no dryness, cracking, pallor, cyanosis, or vesicular eruption. Lower Lip-Normal: no dryness, cracking, pallor, cyanosis or vesicular eruption. Buccal Mucosa- Bilateral- No Aphthous ulcers. Oropharynx- No Discharge or Erythema. +pnd. Tonsils: Characteristics- Bilateral- No Erythema or Congestion. Size/Enlargement- Bilateral- No enlargement. Discharge- bilateral-None.  Neck Neck- Supple. No Masses.   Chest and Lung Exam Auscultation: Breath Sounds:-Clear even and  unlabored.  Cardiovascular Auscultation:Rythm- Regular, rate and rhythm. Murmurs & Other Heart Sounds:Ausculatation of the heart reveal- No Murmurs.  Lymphatic Head & Neck General Head & Neck Lymphatics: Bilateral: Description- No Localized lymphadenopathy.       Assessment & Plan:  For conjunctivitis which is likely bacterial will rx tobrex. Work note today. If constant dc by tomorrow then stay out of work and we can give note.  For nasal congestion rx flonase.  If sinus pressure worsen indicating infection then start azithromycin.  Follow up in 7 days or as needed  Mary Knox, Mary Knox, [name]

## 2016-04-11 ENCOUNTER — Ambulatory Visit (INDEPENDENT_AMBULATORY_CARE_PROVIDER_SITE_OTHER): Payer: BC Managed Care – PPO | Admitting: Family Medicine

## 2016-04-11 ENCOUNTER — Encounter: Payer: Self-pay | Admitting: Family Medicine

## 2016-04-11 VITALS — BP 120/78 | HR 64 | Temp 97.7°F | Ht 67.5 in | Wt 275.0 lb

## 2016-04-11 DIAGNOSIS — H1033 Unspecified acute conjunctivitis, bilateral: Secondary | ICD-10-CM | POA: Diagnosis not present

## 2016-04-11 MED ORDER — TOBRAMYCIN 0.3 % OP SOLN: 2.0000 [drp] | OPHTHALMIC | 0 refills | Status: DC

## 2016-04-11 NOTE — Progress Notes (Signed)
Chief Complaint  Patient presents with  . Eye Problem    Redness    Mary Knox is here for bilateral eye irritation.  Duration: 7 days Chemical exposure? No  Recent URI? Yes  Contact lenses? Yes  History of allergies? No  Treatment to date: Tobradex. Only used for 3 days and then stopped. Had improvement then worsened.  ROS:  Eyes: As noted above  Past Medical History:  Diagnosis Date  . Asthma   . Bipolar disorder ([place])   . Depression    Family History  Problem Relation Age of Onset  . Alcohol abuse Father   . Diabetes Paternal Grandfather   . Hypertension    . Stroke Paternal Grandfather   . Bipolar disorder Father     also OCD  . Parkinsonism    . Hypothyroidism Mother     BP 120/78   Pulse 64   Temp 97.7 F (36.5 C) (Oral)   Ht 5' 7.5" (1.715 m)   Wt 275 lb (124.7 kg)   LMP 03/28/2016   SpO2 100%   BMI 42.44 kg/m  Gen: Awake, alert, appears stated age Eyes: Lids mildly erythematous, Sclera injected b/l, PERRLA, EOMi Psych: Age appropriate judgment and insight; mood and affect normal  Acute bacterial conjunctivitis of both eyes - Plan: tobramycin (TOBREX) 0.3 % ophthalmic solution  Orders as above. Noncompliance. Use for 7-10 days. Instructed to practice good hand hygiene and try not to touch face. Warm compresses and artificial tears also recommended. F/u if no improvement in 7-10 days. Pt voiced understanding and agreement to the plan.  [place], DO 04/11/16 9:16 AM

## 2016-04-11 NOTE — Patient Instructions (Addendum)
Use the drops for 7 days.  Try not to touch your face. Avoid contact lenses over the next 7 days.

## 2016-04-11 NOTE — Progress Notes (Signed)
Pre visit review using our clinic tool,if applicable. No additional management support is needed unless otherwise documented below in the visit note.  

## 2016-04-18 ENCOUNTER — Ambulatory Visit (INDEPENDENT_AMBULATORY_CARE_PROVIDER_SITE_OTHER): Payer: BC Managed Care – PPO | Admitting: Family Medicine

## 2016-04-18 ENCOUNTER — Encounter: Payer: Self-pay | Admitting: Family Medicine

## 2016-04-18 VITALS — BP 126/86 | HR 89 | Temp 99.3°F | Resp 16 | Ht 67.5 in | Wt 275.8 lb

## 2016-04-18 DIAGNOSIS — H04123 Dry eye syndrome of bilateral lacrimal glands: Secondary | ICD-10-CM | POA: Diagnosis not present

## 2016-04-18 NOTE — Patient Instructions (Signed)
Use artificial tears as much as you wish. Store brand tears are fine. There is no medication in them so you do not need to worry about how much you are using it. Refresh is the name brand tear available.

## 2016-04-18 NOTE — Progress Notes (Signed)
Chief Complaint  Patient presents with  . folllow up pink eye    not better.    Mary Knox is here for bilateral eye irritation.  Duration: 2 weeks Chemical exposure? No  Recent URI? No  Contact lenses? Yes  History of allergies? No  Treatment to date: Tobramycin x 2; improved and then was not compliant initially, improved again but is still having some redness and irritation. Drainage significantly improved.   ROS:  Eyes: As noted above  Past Medical History:  Diagnosis Date  . Asthma   . Bipolar disorder ([place])   . Depression    Family History  Problem Relation Age of Onset  . Alcohol abuse Father   . Diabetes Paternal Grandfather   . Hypertension    . Stroke Paternal Grandfather   . Bipolar disorder Father     also OCD  . Parkinsonism    . 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 Artificial tears prn. F/u if no improvement in 7-10 days. Pt voiced understanding and agreement to the plan.  [place], DO 04/18/16 1:40 PM

## 2016-04-18 NOTE — Progress Notes (Signed)
Pre visit review using our clinic review tool, if applicable. No additional management support is needed unless otherwise documented below in the visit note. 

## 2016-07-17 ENCOUNTER — Ambulatory Visit: Payer: BC Managed Care – PPO | Admitting: Physician Assistant

## 2016-07-18 ENCOUNTER — Ambulatory Visit (INDEPENDENT_AMBULATORY_CARE_PROVIDER_SITE_OTHER): Payer: BC Managed Care – PPO | Admitting: Medical

## 2016-07-18 ENCOUNTER — Encounter: Payer: Self-pay | Admitting: Medical

## 2016-07-18 ENCOUNTER — Ambulatory Visit: Payer: BC Managed Care – PPO | Admitting: Medical

## 2016-07-18 VITALS — BP 129/82 | HR 90 | Temp 98.2°F | Ht 67.5 in | Wt 276.8 lb

## 2016-07-18 DIAGNOSIS — K649 Unspecified hemorrhoids: Secondary | ICD-10-CM

## 2016-07-18 MED ORDER — HYDROCORTISONE ACETATE 25 MG RE SUPP: 25.0000 mg | Freq: Two times a day (BID) | RECTAL | 0 refills | Status: DC

## 2016-07-18 NOTE — Patient Instructions (Signed)
For recent moderate to severe hemorrhoid flare will refer to surgeon.   I am making annusol hc supp available. Stay hydrated and avoid constipation. Use stool softener if needed.  If not cal by Tuesday regarding referral then call her and get update. Speak with Anderson Malta.  Follow up with [place] as needed

## 2016-07-18 NOTE — Progress Notes (Signed)
Pre visit review using our clinic tool,if applicable. No additional management support is needed unless otherwise documented below in the visit note.  

## 2016-07-18 NOTE — Progress Notes (Signed)
Subjective:    Patient ID: Mary Knox, female    DOB: [birth year], 36 y.o.   MRN: [id number]  HPI  Pt in states last weekend she had flare up of her hemorrhoids. Pt states was severe flare. She could not sit for 3-4 days. She got med otc hemorrhoid relief cream and her symptoms got better. Pt states during the flare she had constipated. She took stool softener and stopped coffee. Night before last had stools.   No bm since Tuesday.   Pt states she could feel hemorrhoids. They were 5 in total. Only one larger per pt report.  Pt had about 4 prior episodes over the year but hemorrhoids would resolve quickly.   Pt states better today and she wants to go ahead and be referred. She wants to present reoccurence in future.  Reports pain around hemorrhoid at time faint bright red blood when she wipes. Not itching.    Review of Systems  Constitutional: Negative for chills, fatigue and fever.  Respiratory: Negative for chest tightness, shortness of breath and wheezing.   Cardiovascular: Negative for chest pain and palpitations.  Gastrointestinal: Negative for abdominal pain.       Hx of hemorrhoids.  Genitourinary: Negative for dysuria, genital sores, hematuria, urgency and vaginal pain.  Musculoskeletal: Negative for back pain.  Skin: Negative for rash.  Neurological: Negative for dizziness and headaches.  Hematological: Negative for adenopathy. Does not bruise/bleed easily.  Psychiatric/Behavioral: Negative for behavioral problems and confusion.    Past Medical History:  Diagnosis Date  . Asthma   . Bipolar disorder ([place])   . Depression      Social History   Social History  . Marital status: Married    Spouse name: N/A  . Number of children: N/A  . Years of education: N/A   Occupational History  . Not on file.   Social History Main Topics  . Smoking status: Never Smoker  . Smokeless tobacco: Never Used  . Alcohol use Yes     Comment:  rarely  . Drug use: No  .  Sexual activity: Not on file   Other Topics Concern  . Not on file   Social History Narrative  . No narrative on file    Past Surgical History:  Procedure Laterality Date  . WISDOM TOOTH EXTRACTION      Family History  Problem Relation Age of Onset  . Alcohol abuse Father   . Diabetes Paternal Grandfather   . Hypertension    . Stroke Paternal Grandfather   . Bipolar disorder Father     also OCD  . Parkinsonism    . Hypothyroidism Mother     No Known Allergies  Current Outpatient Prescriptions on File Prior to Visit  Medication Sig Dispense Refill  . albuterol (PROVENTIL HFA;VENTOLIN HFA) 108 (90 BASE) MCG/ACT inhaler Inhale 2 puffs into the lungs as needed for wheezing or shortness of breath.    . ARIPiprazole (ABILIFY) 10 MG tablet Take 10 mg by mouth daily.    . Cholecalciferol (VITAMIN D) 2000 UNITS CAPS Take by mouth.    . lamoTRIgine (LAMICTAL) 200 MG tablet Take 200 mg by mouth daily.    Marland Kitchen levothyroxine (SYNTHROID, LEVOTHROID) 25 MCG tablet Take one (1) tablet 25 mcg QOD  Alternating with two (2) tablets 50 mcg QOD    . metFORMIN (GLUCOPHAGE-XR) 500 MG 24 hr tablet Take 2 (500 mg. ) tab. by mouth with breakfast and with supper every day.    Marland Kitchen  phentermine (ADIPEX-P) 37.5 MG tablet Take 1/2 tab.at breakfast and 1/2 tab. after lunch    . fluticasone (FLONASE) 50 MCG/ACT nasal spray Place 2 sprays into both nostrils daily. (Patient not taking: Reported on 07/18/2016) 16 g 1  . tobramycin (TOBREX) 0.3 % ophthalmic solution Place 2 drops into both eyes every 4 (four) hours. (Patient not taking: Reported on 07/18/2016) 5 mL 0   No current facility-administered medications on file prior to visit.     BP 129/82   Pulse 90   Temp 98.2 F (36.8 C) (Oral)   Ht 5' 7.5" (1.715 m)   Wt 276 lb 12.8 oz (125.6 kg)   LMP 07/18/2016   SpO2 98%   BMI 42.71 kg/m       Objective:   Physical Exam  General- No acute distress. Pleasant patient. Neck- Full range of motion, no  jvd Lungs- Clear, even and unlabored. Heart- regular rate and rhythm. Neurologic- CNII- XII grossly intact.  Rectal- deffered by pt.      Assessment & Plan:  For recent moderate to severe hemorrhoid flare will refer to surgeon.   I am making annusol hc supp available. Stay hydrated and avoid constipation. Use stool softener if needed.  If not cal by Tuesday regarding referral then call her and get update. Speak with Anderson Malta.  Follow up with [place] as needed  Alvia Jablonski, Percell Miller, PA-C

## 2016-12-24 ENCOUNTER — Telehealth: Payer: Self-pay | Admitting: Family Medicine

## 2016-12-24 NOTE — Telephone Encounter (Signed)
Relation to pt: self Call back number:[phone]   Reason for call:  Patient requesting to pick up immunization/vaccination records and would like to pick up, please advise

## 2016-12-25 NOTE — Telephone Encounter (Signed)
Printed /at the front for pickup/patient notified.

## 2016-12-31 ENCOUNTER — Ambulatory Visit: Payer: BC Managed Care – PPO | Admitting: Family Medicine

## 2017-01-02 ENCOUNTER — Ambulatory Visit: Payer: BC Managed Care – PPO | Admitting: Family Medicine

## 2017-01-07 ENCOUNTER — Other Ambulatory Visit: Payer: Self-pay | Admitting: Family Medicine

## 2017-01-07 ENCOUNTER — Ambulatory Visit (INDEPENDENT_AMBULATORY_CARE_PROVIDER_SITE_OTHER): Payer: BC Managed Care – PPO | Admitting: Family Medicine

## 2017-01-07 ENCOUNTER — Encounter: Payer: Self-pay | Admitting: Family Medicine

## 2017-01-07 VITALS — BP 116/70 | HR 81 | Temp 98.3°F | Ht 67.5 in | Wt 273.6 lb

## 2017-01-07 DIAGNOSIS — Z23 Encounter for immunization: Secondary | ICD-10-CM

## 2017-01-07 DIAGNOSIS — E781 Pure hyperglyceridemia: Secondary | ICD-10-CM | POA: Diagnosis not present

## 2017-01-07 DIAGNOSIS — Z0000 Encounter for general adult medical examination without abnormal findings: Secondary | ICD-10-CM | POA: Diagnosis not present

## 2017-01-07 DIAGNOSIS — J45909 Unspecified asthma, uncomplicated: Secondary | ICD-10-CM

## 2017-01-07 DIAGNOSIS — L739 Follicular disorder, unspecified: Secondary | ICD-10-CM

## 2017-01-07 DIAGNOSIS — Z111 Encounter for screening for respiratory tuberculosis: Secondary | ICD-10-CM | POA: Diagnosis not present

## 2017-01-07 NOTE — Progress Notes (Signed)
Patient ID: Mary Knox, female    DOB: 02-01-1981  Age: 36 y.o. MRN: [id number]    Subjective:  Subjective  HPI Mary Knox presents to have her form filled out for school.  She does not want a physical.   Review of Systems  Constitutional: Negative for activity change, appetite change, fatigue and unexpected weight change.  Respiratory: Negative for cough and shortness of breath.   Cardiovascular: Negative for chest pain and palpitations.  Psychiatric/Behavioral: Negative for behavioral problems and dysphoric mood. The patient is not nervous/anxious.     History Past Medical History:  Diagnosis Date  . Asthma   . Bipolar disorder ([place])   . Depression     She has a past surgical history that includes Wisdom tooth extraction.   Her family history includes Alcohol abuse in her father; Bipolar disorder in her father; Diabetes in her paternal grandfather; Hypertension in her unknown relative; Hypothyroidism in her mother; Parkinsonism in her unknown relative; Stroke in her paternal grandfather.She reports that she has never smoked. She has never used smokeless tobacco. She reports that she drinks alcohol. She reports that she does not use drugs.  Current Outpatient Prescriptions on File Prior to Visit  Medication Sig Dispense Refill  . albuterol (PROVENTIL HFA;VENTOLIN HFA) 108 (90 BASE) MCG/ACT inhaler Inhale 2 puffs into the lungs as needed for wheezing or shortness of breath.    . ARIPiprazole (ABILIFY) 10 MG tablet Take 10 mg by mouth daily.    . Cholecalciferol (VITAMIN D) 2000 UNITS CAPS Take by mouth.    . lamoTRIgine (LAMICTAL) 200 MG tablet Take 200 mg by mouth daily.    Marland Kitchen levothyroxine (SYNTHROID, LEVOTHROID) 25 MCG tablet Take one (1) tablet 25 mcg QOD  Alternating with two (2) tablets 50 mcg QOD    . metFORMIN (GLUCOPHAGE-XR) 500 MG 24 hr tablet Take 2 (500 mg. ) tab. by mouth with breakfast and with supper every day.    . phentermine (ADIPEX-P) 37.5 MG tablet Take  1/2 tab.at breakfast and 1/2 tab. after lunch    . fluticasone (FLONASE) 50 MCG/ACT nasal spray Place 2 sprays into both nostrils daily. (Patient not taking: Reported on 01/07/2017) 16 g 1   No current facility-administered medications on file prior to visit.      Objective:  Objective  Physical Exam  Constitutional: She is oriented to person, place, and time. She appears well-developed and well-nourished.  HENT:  Head: Normocephalic and atraumatic.  Eyes: Conjunctivae and EOM are normal.  Neck: Normal range of motion. Neck supple. No JVD present. Carotid bruit is not present. No thyromegaly present.  Cardiovascular: Normal rate, regular rhythm and normal heart sounds.   No murmur heard. Pulmonary/Chest: Effort normal and breath sounds normal. No respiratory distress. She has no wheezes. She has no rales. She exhibits no tenderness.  Musculoskeletal: She exhibits no edema.  Neurological: She is alert and oriented to person, place, and time.  Skin: Rash noted.  Papules base of scalp---  + scab Pt states it will not heal  Psychiatric: She has a normal mood and affect.  Nursing note and vitals reviewed.  BP 116/70 (BP Location: Left Arm, Patient Position: Sitting)   Pulse 81   Temp 98.3 F (36.8 C) (Oral)   Ht 5' 7.5" (1.715 m)   Wt 273 lb 9.6 oz (124.1 kg)   LMP 01/02/2017   SpO2 97%   BMI 42.22 kg/m  Wt Readings from Last 3 Encounters:  01/07/17 273 lb 9.6 oz (  124.1 kg)  07/18/16 276 lb 12.8 oz (125.6 kg)  04/18/16 275 lb 12.8 oz (125.1 kg)     Lab Results  Component Value Date   WBC 13.0 (H) 11/12/2012   HGB 14.6 11/12/2012   HCT 43.4 11/12/2012   PLT 334.0 11/12/2012   GLUCOSE 119 (H) 11/12/2012   CHOL 212 (H) 03/23/2009   TRIG 166.0 (H) 03/23/2009   HDL 45.30 03/23/2009   LDLDIRECT 145.7 03/23/2009   ALT 38 (H) 11/12/2012   AST 25 11/12/2012   NA 138 11/12/2012   K 3.6 11/12/2012   CL 105 11/12/2012   CREATININE 1.0 11/12/2012   BUN 11 11/12/2012   CO2 26  11/12/2012   TSH 1.81 11/12/2012   HGBA1C 5.6 11/12/2012    Dg Chest 2 View  Result Date: 05/16/2015 CLINICAL DATA:  Cough for 5 days.  On antibiotics. EXAM: CHEST  2 VIEW COMPARISON:  None FINDINGS: The heart size and mediastinal contours are within normal limits. Scar like density is noted in the left midlung. The visualized skeletal structures are unremarkable. IMPRESSION: No active cardiopulmonary disease. Electronically Signed   By: Kerby Moors M.D.   On: 05/16/2015 14:42     Assessment & Plan:  Plan  I have discontinued Mary Knox's tobramycin and hydrocortisone. I am also having her maintain her ARIPiprazole, lamoTRIgine, Vitamin D, albuterol, fluticasone, metFORMIN, phentermine, and levothyroxine.  No orders of the defined types were placed in this encounter.   Problem List Items Addressed This Visit      Unprioritized   Asthma    Stable con't meds      HYPERTRIGLYCERIDEMIA    Labs per endo       Other Visit Diagnoses    Preventative health care    -  Primary   Relevant Orders   Measles/Mumps/Rubella Immunity   Hepatitis B Surface AntiBODY   Folliculitis       Relevant Orders   Ambulatory referral to Dermatology   Need for prophylactic vaccination and inoculation against influenza       Relevant Orders   Flu Vaccine QUAD 36+ mos IM (Fluarix & Fluzone Quad PF (Completed)   PPD screening test       Relevant Orders   TB Skin Test (Completed)      Follow-up: Return in about 2 days (around 01/09/2017), or if symptoms worsen or fail to improve, for ppd read-- nurse visit.  Ann Held, DO

## 2017-01-07 NOTE — Assessment & Plan Note (Signed)
Stable con't meds 

## 2017-01-07 NOTE — Assessment & Plan Note (Signed)
Labs per endo

## 2017-01-07 NOTE — Patient Instructions (Signed)
Tuberculin Skin Test  Why am I having this test?  Tuberculosis (TB) is a bacterial infection caused by Mycobacterium tuberculosis. Most people who are exposed to these bacteria have a strong enough defense (immune) system to prevent the bacteria from causing TB and developing symptoms. Their bodies prevent the germs from being active and making them sick (latent TB infection).  However, if you have TB germs in your body and your immune system is weak, you can develop a TB infection. This can cause symptoms such as:  · Night sweats.  · Fever.  · Weakness.  · Weight loss.    A latent TB infection can also become active later in life if your immune system becomes weakened or compromised.  You may have this test if your health care provider suspects that you have TB. You may also have this test to screen for TB if you are at risk for getting the disease. Those at increased risk include:  · People who inject illegal drugs or share needles.  · People with HIV or other diseases that affect immunity.  · Health care workers.  · People who live in high-risk communities, such as homeless shelters, nursing homes, and correctional facilities.  · People who have been in contact with someone with TB.  · People from countries where TB is more common.    If you are in a high-risk group, your health care provider may wish to screen for TB more often. This can help prevent the spread of the disease. Sometimes TB screening is required when starting a new job, such as becoming a health care worker or a teacher. Colleges or universities may require it of new students.  What is being tested?  A tuberculin skin test is the main test used to check for exposure to the bacteria that can cause TB. The test checks for antibodies to the bacteria. Antibodies are proteins that your body produces to protect you from germs and other things that can make you sick.  Your health care provider will inject a solution known as PPD (purified  protein derivative) under the first layer of skin on your arm. This causes a blister-like bubble to form at the site. Your health care provider will then examine the site after a number of hours have passed to see if a reaction has occurred.  How do I prepare for this test?  There is no preparation required for this test.  What do the results mean?  Your test results will be reported as either negative or positive.  If the tuberculin skin test produces a negative result, it is likely that you do not have TB and have not been exposed to the TB bacteria.  If you or your health care provider suspects exposure, however, you may want to repeat the test a few weeks later. A blood test may also be used to check for TB. This is because you will not react to the tuberculin skin test until several weeks after exposure to TB bacteria.  If you test positive to the tuberculin skin test, it is likely that you have been exposed to TB bacteria. The test does not distinguish between an active and a latent TB infection.  A false-positive result can occur. A false-positive result for TB bacteria is incorrect because it indicates a condition or finding is present when it is not.  Talk to your health care provider to discuss your results, treatment options, and if necessary, the need for more tests.    It is your responsibility to obtain your test results. Ask the lab or department performing the test when and how you will get your results. Talk with your health care provider if you have any questions about your results.  Talk with your health care provider to discuss your results, treatment options, and if necessary, the need for more tests. Talk with your health care provider if you have any questions about your results.  This information is not intended to replace advice given to you by your health care provider. Make sure you discuss any questions you have with your health care provider.   Document Released: 01/30/2005 Document Revised: 12/24/2015 Document Reviewed: 08/16/2013  Elsevier Interactive Patient Education © 2018 [employer]

## 2017-01-09 LAB — MEASLES/MUMPS/RUBELLA IMMUNITY
Mumps IgG: 92.1 AU/mL
Rubella: 2.56 index
Rubeola IgG: <25 AU/mL — ABNORMAL LOW

## 2017-01-09 LAB — HEPATITIS B SURFACE ANTIBODY,QUALITATIVE: HEP B S AB: NONREACTIVE

## 2017-01-10 ENCOUNTER — Ambulatory Visit (INDEPENDENT_AMBULATORY_CARE_PROVIDER_SITE_OTHER): Payer: BC Managed Care – PPO | Admitting: Behavioral Health

## 2017-01-10 DIAGNOSIS — Z23 Encounter for immunization: Secondary | ICD-10-CM

## 2017-01-10 LAB — TB SKIN TEST
Induration: 0 mm
TB Skin Test: NEGATIVE

## 2017-01-10 NOTE — Progress Notes (Signed)
Pre visit review using our clinic review tool, if applicable. No additional management support is needed unless otherwise documented below in the visit note.  Patient came in clinic for Hepatitis B & MMR vaccination. She tolerated both injections well. No signs or symptoms of a reaction before leaving the nurse visit.

## 2017-03-12 ENCOUNTER — Ambulatory Visit (INDEPENDENT_AMBULATORY_CARE_PROVIDER_SITE_OTHER): Payer: BC Managed Care – PPO

## 2017-03-12 ENCOUNTER — Other Ambulatory Visit: Payer: Self-pay

## 2017-03-12 DIAGNOSIS — Z23 Encounter for immunization: Secondary | ICD-10-CM

## 2017-03-12 MED ORDER — ALBUTEROL SULFATE HFA 108 (90 BASE) MCG/ACT IN AERS: 2.0000 | INHALATION_SPRAY | RESPIRATORY_TRACT | 1 refills | Status: DC | PRN

## 2017-05-27 ENCOUNTER — Telehealth: Payer: Self-pay | Admitting: Family Medicine

## 2017-05-27 NOTE — Telephone Encounter (Signed)
Patient Immunization Summary mailed to patient,due to patient not picking up request. RX bin was cleaned on .

## 2017-07-02 ENCOUNTER — Ambulatory Visit: Payer: BC Managed Care – PPO | Admitting: Family Medicine

## 2017-07-02 ENCOUNTER — Ambulatory Visit: Payer: BC Managed Care – PPO | Admitting: Internal Medicine

## 2017-07-02 ENCOUNTER — Encounter: Payer: Self-pay | Admitting: Family Medicine

## 2017-07-02 VITALS — BP 122/82 | HR 79 | Temp 98.6°F | Ht 67.5 in | Wt 280.5 lb

## 2017-07-02 DIAGNOSIS — L03313 Cellulitis of chest wall: Secondary | ICD-10-CM

## 2017-07-02 MED ORDER — CEPHALEXIN 500 MG PO CAPS: 500.0000 mg | ORAL_CAPSULE | Freq: Three times a day (TID) | ORAL | 0 refills | Status: AC

## 2017-07-02 NOTE — Patient Instructions (Addendum)
Things to look out for: increasing pain not relieved by ibuprofen/acetaminophen, fevers, spreading redness, drainage of pus, or foul odor.  If there is an opening over the area, use triple antibiotic ointment twice daily.  Let us know if you need anything.

## 2017-07-02 NOTE — Progress Notes (Signed)
Chief Complaint  Patient presents with  . Abscess    on chest    Mary Knox is a 37 y.o. female here for a skin complaint.  Duration: 2 weeks Location: R upper chest Pruritic? No Painful? No Drainage? Yes New soaps/lotions/topicals/detergents? No Sick contacts? No Other associated symptoms: Scabs over Therapies tried thus far: none  ROS:  Const: No fevers Skin: As noted in HPI  Past Medical History:  Diagnosis Date  . Asthma   . Bipolar disorder ([place])   . Depression    No Known Allergies Allergies as of 07/02/2017   No Known Allergies     Medication List        Accurate as of 07/02/17  2:13 PM. Always use your most recent med list.          albuterol 108 (90 Base) MCG/ACT inhaler Commonly known as:  PROVENTIL HFA;VENTOLIN HFA Inhale 2 puffs as needed into the lungs for wheezing or shortness of breath.   ARIPiprazole 10 MG tablet Commonly known as:  ABILIFY Take 10 mg by mouth daily.   cephALEXin 500 MG capsule Commonly known as:  KEFLEX Take 1 capsule (500 mg total) by mouth 3 (three) times daily for 7 days.   fluticasone 50 MCG/ACT nasal spray Commonly known as:  FLONASE Place 2 sprays into both nostrils daily.   lamoTRIgine 200 MG tablet Commonly known as:  LAMICTAL Take 200 mg by mouth daily.   levothyroxine 25 MCG tablet Commonly known as:  SYNTHROID, LEVOTHROID Take one (1) tablet 25 mcg QOD  Alternating with two (2) tablets 50 mcg QOD   metFORMIN 500 MG 24 hr tablet Commonly known as:  GLUCOPHAGE-XR Take 2 (500 mg. ) tab. by mouth with breakfast and with supper every day.   phentermine 37.5 MG tablet Commonly known as:  ADIPEX-P Take 1/2 tab.at breakfast and 1/2 tab. after lunch   Vitamin D 2000 units Caps Take by mouth.       BP 122/82 (BP Location: Left Arm, Patient Position: Sitting, Cuff Size: Large)   Pulse 79   Temp 98.6 F (37 C) (Oral)   Ht 5' 7.5" (1.715 m)   Wt 280 lb 8 oz (127.2 kg)   SpO2 97%   BMI 43.28 kg/m    Gen: awake, alert, appearing stated age Lungs: No accessory muscle use Skin: See below. No drainage, +erythema, no TTP, fluctuance, excoriation Psych: Age appropriate judgment and insight  Media Information     Cellulitis of chest wall - Plan: cephALEXin (KEFLEX) 500 MG capsule  Orders as above. Tx for 7 d. Warning s/s's verbalized and written down.  F/u prn. The patient voiced understanding and agreement to the plan.  [place], DO 07/02/17 2:13 PM

## 2017-07-02 NOTE — Progress Notes (Signed)
Pre visit review using our clinic review tool, if applicable. No additional management support is needed unless otherwise documented below in the visit note. 

## 2017-08-23 ENCOUNTER — Encounter: Payer: Self-pay | Admitting: Family Medicine

## 2017-08-23 ENCOUNTER — Ambulatory Visit: Payer: BC Managed Care – PPO | Admitting: Family Medicine

## 2017-08-23 VITALS — BP 132/78 | HR 72 | Temp 98.7°F | Ht 67.0 in

## 2017-08-23 DIAGNOSIS — B309 Viral conjunctivitis, unspecified: Secondary | ICD-10-CM | POA: Insufficient documentation

## 2017-08-23 MED ORDER — ERYTHROMYCIN 5 MG/GM OP OINT: 1.0000 "application " | TOPICAL_OINTMENT | Freq: Three times a day (TID) | OPHTHALMIC | 0 refills | Status: AC

## 2017-08-23 NOTE — Progress Notes (Signed)
Mary Knox - 37 y.o. female MRN [id number]  Date of birth: 07-21-1980  SUBJECTIVE:  Including CC & ROS.  Chief Complaint  Patient presents with  . Conjunctivitis    Mary Knox is a 37 y.o. female that is presenting with pink eye. Symptoms started two days ago. She woke up with swelling, redness  and drainage in her eye. She has not taken anything for her eyes. She is Pharmacist, hospital. It started in her right eye and now has moved to the left. No foreign body in her eyes. Wearing contacts made it worse.    Review of Systems  Constitutional: Negative for fever.  HENT: Negative for congestion.   Eyes: Positive for redness.    HISTORY: Past Medical, Surgical, Social, and Family History Reviewed & Updated per EMR.   Pertinent Historical Findings include:  Past Medical History:  Diagnosis Date  . Asthma   . Bipolar disorder ([place])   . Depression     Past Surgical History:  Procedure Laterality Date  . WISDOM TOOTH EXTRACTION      No Known Allergies  Family History  Problem Relation Age of Onset  . Alcohol abuse Father   . Diabetes Paternal Grandfather   . Hypertension Unknown   . Stroke Paternal Grandfather   . Bipolar disorder Father        also OCD  . Parkinsonism Unknown   . Hypothyroidism Mother      Social History   Socioeconomic History  . Marital status: Married    Spouse name: Not on file  . Number of children: Not on file  . Years of education: Not on file  . Highest education level: Not on file  Occupational History  . Not on file  Social Needs  . Financial resource strain: Not on file  . Food insecurity:    Worry: Not on file    Inability: Not on file  . Transportation needs:    Medical: Not on file    Non-medical: Not on file  Tobacco Use  . Smoking status: Never Smoker  . Smokeless tobacco: Never Used  Substance and Sexual Activity  . Alcohol use: Yes    Comment:  rarely  . Drug use: No  . Sexual activity: Not on file  Lifestyle  .  Physical activity:    Days per week: Not on file    Minutes per session: Not on file  . Stress: Not on file  Relationships  . Social connections:    Talks on phone: Not on file    Gets together: Not on file    Attends religious service: Not on file    Active member of club or organization: Not on file    Attends meetings of clubs or organizations: Not on file    Relationship status: Not on file  . Intimate partner violence:    Fear of current or ex partner: Not on file    Emotionally abused: Not on file    Physically abused: Not on file    Forced sexual activity: Not on file  Other Topics Concern  . Not on file  Social History Narrative  . Not on file     PHYSICAL EXAM:  VS: BP 132/78 (BP Location: Left Arm, Patient Position: Sitting, Cuff Size: Normal)   Pulse 72   Temp 98.7 F (37.1 C) (Oral)   Ht 5\' 7"  (1.702 m)   SpO2 97%   BMI 43.93 kg/m  Physical Exam Gen: NAD, alert, cooperative with  exam, well-appearing ENT: normal lips, normal nasal mucosa,  Eye: normal EOM, injected conjunctiva b/l, normal lids CV:  no edema, +2 pedal pulses   Resp: no accessory muscle use, non-labored,  Skin: no rashes, no areas of induration  Neuro: normal tone, normal sensation to touch Psych:  normal insight, alert and oriented MSK: normal gait, normal strength       ASSESSMENT & PLAN:   Acute viral conjunctivitis of both eyes 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  - erythromycin if symptoms don't improve after a couple more days - counseled on supportive care - given indications to followup

## 2017-08-23 NOTE — Assessment & Plan Note (Signed)
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  - erythromycin if symptoms don't improve after a couple more days - counseled on supportive care - given indications to followup

## 2017-08-23 NOTE — Patient Instructions (Signed)
Please practice good hand hygiene. You can use the antibiotics if there is no improvement in your symptoms over the next couple of days.   Viral Conjunctivitis, Adult Viral conjunctivitis is an inflammation of the clear membrane that covers the white part of your eye and the inner surface of your eyelid (conjunctiva). The inflammation is caused by a viral infection. The blood vessels in the conjunctiva become inflamed, causing the eye to become red or pink, and often itchy. Viral conjunctivitis can be easily passed from one person to another (is contagious). This condition is often called pink eye. What are the causes? This condition is caused by a virus. A virus is a type of contagious germ. It can be spread by touching objects that have been contaminated with the virus, such as doorknobs or towels. It can also be passed through droplets, such as from coughing or sneezing. What are the signs or symptoms? Symptoms of this condition include:  Eye redness.  Tearing or watery eyes.  Itchy and irritated eyes.  Burning feeling in the eyes.  Clear drainage from the eye.  Swollen eyelids.  A gritty feeling in the eye.  Light sensitivity.  This condition often occurs with other symptoms, such as a fever, nausea, or a rash. How is this diagnosed? This condition is diagnosed with a medical history and physical exam. If you have discharge from your eye, the discharge may be tested to rule out other causes of conjunctivitis. How is this treated? Viral conjunctivitis does not respond to medicines that kill bacteria (antibiotics). Treatment for viral conjunctivitis is directed at stopping a bacterial infection from developing in addition to the viral infection. Treatment also aims to relieve your symptoms, such as itching. This may be done with antihistamine drops or other eye medicines. Rarely, steroid eye drops or antiviral medicines may be prescribed. Follow these instructions at  home: Medicines   Take or apply over-the-counter and prescription medicines only as told by your health care provider.  Be very careful to avoid touching the edge of the eyelid with the eye drop bottle or ointment tube when applying medicines to the affected eye. Being careful this way will stop you from spreading the infection to the other eye or to other people. Eye care  Avoid touching or rubbing your eyes.  Apply a warm, wet, clean washcloth to your eye for 10-20 minutes, 3-4 times per day or as told by your health care provider.  If you wear contact lenses, do not wear them until the inflammation is gone and your health care provider says it is safe to wear them again. Ask your health care provider how to sterilize or replace your contact lenses before using them again. Wear glasses until you can resume wearing contacts.  Avoid wearing eye makeup until the inflammation is gone. Throw away any old eye cosmetics that may be contaminated.  Gently wipe away any drainage from your eye with a warm, wet washcloth or a cotton ball. General instructions  Change or wash your pillowcase every day or as told by your health care provider.  Do not share towels, pillowcases, washcloths, eye makeup, makeup brushes, contact lenses, or glasses. This may spread the infection.  Wash your hands often with soap and water. Use paper towels to dry your hands. If soap and water are not available, use hand sanitizer.  Try to avoid contact with other people for one week or as told by your health care provider. Contact a health care provider if:  Your symptoms do not improve with treatment or they get worse.  You have increased pain.  Your vision becomes blurry.  You have a fever.  You have facial pain, redness, or swelling.  You have yellow or green drainage coming from your eye.  You have new symptoms. This information is not intended to replace advice given to you by your health care provider.  Make sure you discuss any questions you have with your health care provider. Document Released: 07/13/2002 Document Revised: 11/18/2015 Document Reviewed: 11/07/2015 Elsevier Interactive Patient Education  [employer].

## 2017-08-25 ENCOUNTER — Telehealth: Payer: Self-pay | Admitting: Family Medicine

## 2017-08-25 NOTE — Telephone Encounter (Signed)
Copied from [place] [id number]. Topic: Quick Communication - See Telephone Encounter >> Aug 25, 2017  2:07 PM Bea Graff, NT wrote: CRM for notification. See Telephone encounter for: 08/25/17. Pt states that the Kristopher Oppenheim will not her erythromycin ophthalmic ointment until Wednesday and [facility] [place]. [id number] [place], [address]. [phone] (Phone) [phone] (Fax)

## 2017-08-25 NOTE — Telephone Encounter (Signed)
Routed in error

## 2017-08-25 NOTE — Telephone Encounter (Signed)
Erythromycin Ophthalmic ointment prescription sent to Mary Knox on 4/20 will not be available until Wed. Pt would like for medication to be [facility] [place].   Spoke with [facility] Baas at [facility] who states that medication can be retrieved from [facility]. Contact number provided for [facility] to [facility].

## 2017-08-27 ENCOUNTER — Ambulatory Visit: Payer: BC Managed Care – PPO | Admitting: Family Medicine

## 2017-08-27 ENCOUNTER — Encounter: Payer: Self-pay | Admitting: Family Medicine

## 2017-08-27 VITALS — BP 130/78 | HR 78 | Temp 98.5°F | Ht 67.0 in | Wt 279.0 lb

## 2017-08-27 DIAGNOSIS — J029 Acute pharyngitis, unspecified: Secondary | ICD-10-CM

## 2017-08-27 LAB — POCT RAPID STREP A (OFFICE): RAPID STREP A SCREEN: NEGATIVE

## 2017-08-27 MED ORDER — PREDNISONE 20 MG PO TABS: ORAL_TABLET | ORAL | 0 refills | Status: DC

## 2017-08-27 NOTE — Progress Notes (Signed)
    Subjective:  Mary Knox is a 37 y.o. female who presents today for same-day appointment with a chief complaint of sore throat.   HPI:  Sore Throat, Acute Issue Started about 5 days.  Symptoms worsen over the last several days.  Associated symptoms include mild rhinorrhea. No fevers.  She has had some Knox with swallowing.  No noted swollen lymph nodes or glands.  Has tried cough drops and Mucinex without significant improvement.  Symptoms are worse at night.  No other obvious alleviating or aggravating factors.  ROS: Per HPI  PMH: She reports that she has never smoked. She has never used smokeless tobacco. She reports that she drinks alcohol. She reports that she does not use drugs.  Objective:  Physical Exam: BP 130/78 (BP Location: Left Arm)   Pulse 78   Temp 98.5 F (36.9 C) (Oral)   Ht 5\' 7"  (1.702 m)   Wt 279 lb (126.6 kg)   SpO2 97%   BMI 43.70 kg/m   Gen: NAD, resting comfortably HEENT: TMs clear bilaterally.  Oropharynx erythematous without exudate.  1+ tonsillar hypertrophy noted bilaterally.  No appreciable lymphadenopathy. CV: RRR with no murmurs appreciated Pulm: NWOB, CTAB with no crackles, wheezes, or rhonchi  Results for orders placed or performed in visit on 08/27/17 (from the past 24 hour(s))  POCT rapid strep A     Status: None   Collection Time: 08/27/17 11:56 AM  Result Value Ref Range   Rapid Strep A Screen Negative Negative   Assessment/Plan:  Sore throat Rapid strep negative.  Likely secondary to viral URI.  We will start a course of glucocorticoids to help with her Knox and inflammation.  Return precautions reviewed.  Follow up as needed.   Algis Greenhouse. Mary Pain, MD 08/27/2017 11:59 AM

## 2017-08-27 NOTE — Patient Instructions (Signed)
Your strep test is negative. Your symptoms are most likely due to a virus.  We will start prednisone to help with the pain and inflammation.  Please take 2 pills daily for the next 5 days, and then 1 pill daily for the next 2 days.  If your symptoms worsen or do not improve over the next few days, please let us know.  TakeCare, Dr. Jerline Pain

## 2017-09-09 ENCOUNTER — Ambulatory Visit (INDEPENDENT_AMBULATORY_CARE_PROVIDER_SITE_OTHER): Payer: BC Managed Care – PPO | Admitting: *Deleted

## 2017-09-09 DIAGNOSIS — Z23 Encounter for immunization: Secondary | ICD-10-CM | POA: Diagnosis not present

## 2017-09-09 NOTE — Progress Notes (Signed)
Pt. here for third dose of Hepatitis B series per Dr. Carollee Herter. Pt. given IM in L deltoid, tolerated well. VIS given.

## 2018-01-22 ENCOUNTER — Telehealth: Payer: Self-pay | Admitting: *Deleted

## 2018-01-22 DIAGNOSIS — E119 Type 2 diabetes mellitus without complications: Secondary | ICD-10-CM

## 2018-01-22 DIAGNOSIS — E063 Autoimmune thyroiditis: Secondary | ICD-10-CM

## 2018-01-22 NOTE — Telephone Encounter (Signed)
Copied from [place] [id number]. Topic: Referral - Request >> Jan 22, 2018 11:15 AM Reyne Dumas L wrote: Reason for CRM:   Pt calling to find out of provider would recommend a new endocrinologist for her.  Pt has been seeing Dr. Awilda Metro in [facility] for quite a while, but he is getting ready to retire and she would like to start seeing someone in [place]. Pt can be reached at [phone]

## 2018-01-22 NOTE — Telephone Encounter (Signed)
ENDO referral placed to [place].

## 2018-02-05 ENCOUNTER — Ambulatory Visit: Payer: BC Managed Care – PPO | Admitting: Nurse Practitioner

## 2018-02-05 ENCOUNTER — Encounter: Payer: Self-pay | Admitting: Nurse Practitioner

## 2018-02-05 ENCOUNTER — Encounter: Payer: Self-pay | Admitting: Endocrinology

## 2018-02-05 ENCOUNTER — Ambulatory Visit (INDEPENDENT_AMBULATORY_CARE_PROVIDER_SITE_OTHER): Payer: BC Managed Care – PPO | Admitting: Endocrinology

## 2018-02-05 VITALS — BP 120/82 | HR 74 | Temp 98.2°F | Ht 67.0 in | Wt 280.2 lb

## 2018-02-05 DIAGNOSIS — M25532 Pain in left wrist: Secondary | ICD-10-CM

## 2018-02-05 DIAGNOSIS — E039 Hypothyroidism, unspecified: Secondary | ICD-10-CM

## 2018-02-05 NOTE — Progress Notes (Signed)
Subjective:    Patient ID: Mary Knox, female    DOB: 01-03-1981, 37 y.o.   MRN: [id number]  HPI Pt is referred by Dr Mikle Bosworth, for hypothyroidism.  Pt reports hypothyroidism was dx'ed in approx 2000.  she has been on prescribed thyroid hormone therapy since 2015.  She takes synthroid 50/d.  she has never taken kelp or any other type of non-prescribed thyroid product.  she has never had thyroid imaging.  she has never had thyroid surgery, or XRT to the neck.  she has never been on amiodarone or lithium.  She has [place] scheduled for next week.  She has moderate fatigue, and assoc weight gain.  Pt says she is not at risk for pregnancy.   Past Medical History:  Diagnosis Date  . Asthma   . Bipolar disorder ([place])   . Depression     Past Surgical History:  Procedure Laterality Date  . WISDOM TOOTH EXTRACTION      Social History   Socioeconomic History  . Marital status: Single    Spouse name: Not on file  . Number of children: Not on file  . Years of education: Not on file  . Highest education level: Not on file  Occupational History  . Not on file  Social Needs  . Financial resource strain: Not on file  . Food insecurity:    Worry: Not on file    Inability: Not on file  . Transportation needs:    Medical: Not on file    Non-medical: Not on file  Tobacco Use  . Smoking status: Never Smoker  . Smokeless tobacco: Never Used  Substance and Sexual Activity  . Alcohol use: Yes    Comment:  rarely  . Drug use: No  . Sexual activity: Not on file  Lifestyle  . Physical activity:    Days per week: Not on file    Minutes per session: Not on file  . Stress: Not on file  Relationships  . Social connections:    Talks on phone: Not on file    Gets together: Not on file    Attends religious service: Not on file    Active member of club or organization: Not on file    Attends meetings of clubs or organizations: Not on file    Relationship status: Not on file  . Intimate partner  violence:    Fear of current or ex partner: Not on file    Emotionally abused: Not on file    Physically abused: Not on file    Forced sexual activity: Not on file  Other Topics Concern  . Not on file  Social History Narrative  . Not on file    Current Outpatient Medications on File Prior to Visit  Medication Sig Dispense Refill  . albuterol (PROVENTIL HFA;VENTOLIN HFA) 108 (90 Base) MCG/ACT inhaler Inhale 2 puffs as needed into the lungs for wheezing or shortness of breath. 1 Inhaler 1  . ARIPiprazole (ABILIFY) 10 MG tablet Take 10 mg by mouth daily.    Marland Kitchen lamoTRIgine (LAMICTAL) 200 MG tablet Take 200 mg by mouth daily.    Marland Kitchen levothyroxine (SYNTHROID, LEVOTHROID) 50 MCG tablet Take by mouth.    . Vitamin D, Ergocalciferol, (DRISDOL) 50000 units CAPS capsule Take 50,000 Units by mouth every 7 (seven) days.    . metFORMIN (GLUCOPHAGE-XR) 500 MG 24 hr tablet Take 2 (500 mg. ) tab. by mouth with breakfast and with supper every day.    Marland Kitchen  phentermine (ADIPEX-P) 37.5 MG tablet Take 1/2 tab.at breakfast and 1/2 tab. after lunch     No current facility-administered medications on file prior to visit.     No Known Allergies  Family History  Problem Relation Age of Onset  . Alcohol abuse Father   . Bipolar disorder Father        also OCD  . Diabetes Paternal Grandfather   . Stroke Paternal Grandfather   . Hypertension Unknown   . Parkinsonism Unknown   . Hypothyroidism Mother     BP 118/84 (BP Location: Right Arm, Patient Position: Sitting, Cuff Size: Large)   Pulse 82   Ht 5\' 7"  (1.702 m)   Wt 280 lb 3.2 oz (127.1 kg)   LMP 01/19/2018   SpO2 97%   BMI 43.89 kg/m     Review of Systems denies muscle cramps, sob, fever, constipation, numbness of the feet, diplopia, cold intolerance, myalgias, dry skin, rhinorrhea, easy bruising, and syncope.  She has hair loss and difficulty with concentration. Depression is well-controlled.      Objective:   Physical Exam VS: see vs  page GEN: no distress HEAD: head: no deformity eyes: no periorbital swelling, no proptosis.   external nose and ears are normal.   mouth: no lesion seen NECK: supple, thyroid is not enlarged, but I can't tell for sure.   CHEST WALL: no deformity LUNGS: clear to auscultation CV: reg rate and rhythm, no murmur.   ABD: abdomen is soft, nontender.  no hepatosplenomegaly.  not distended.  no hernia.  MUSCULOSKELETAL: muscle bulk and strength are grossly normal.  no obvious joint swelling.  gait is normal and steady.  EXTEMITIES: Trace bilat leg edema PULSES: no carotid bruit NEURO:  cn 2-12 grossly intact.   readily moves all 4's.  sensation is intact to touch on all 4's.   SKIN:  Normal texture and temperature.  No rash or suspicious lesion is visible.  Slight terminal hair on the face.   NODES:  None palpable at the neck PSYCH: alert, well-oriented.  Does not appear anxious nor depressed.    outside test results are reviewed: TSH=4  I have reviewed outside records, and summarized: Pt was noted to have hypothyroidism, and referred here.       Assessment & Plan:  Hypothyroidism, new to me: well-replaced.   Poss goiter: I'll look for [place] results.     Patient Instructions  Please continue the same levothyroxine.   I'll look for the thyroid ultrasound result next Wednesday.    Please come back for a follow-up appointment in 6 months.        Hypothyroidism Hypothyroidism is a disorder of the thyroid. The thyroid is a large gland that is located in the lower front of the neck. The thyroid releases hormones that control how the body works. With hypothyroidism, the thyroid does not make enough of these hormones. What are the causes? Causes of hypothyroidism may include:  Viral infections.  Pregnancy.  Your own defense system (immune system) attacking your thyroid.  Certain medicines.  Birth defects.  Past radiation treatments to your head or neck.  Past treatment with  radioactive iodine.  Past surgical removal of part or all of your thyroid.  Problems with the gland that is located in the center of your brain (pituitary).  What are the signs or symptoms? Signs and symptoms of hypothyroidism may include:  Feeling as though you have no energy (lethargy).  Inability to tolerate cold.  Weight gain that  is not explained by a change in diet or exercise habits.  Dry skin.  Coarse hair.  Menstrual irregularity.  Slowing of thought processes.  Constipation.  Sadness or depression.  How is this diagnosed? Your health care provider may diagnose hypothyroidism with blood tests and ultrasound tests. How is this treated? Hypothyroidism is treated with medicine that replaces the hormones that your body does not make. After you begin treatment, it may take several weeks for symptoms to go away. Follow these instructions at home:  Take medicines only as directed by your health care provider.  If you start taking any new medicines, tell your health care provider.  Keep all follow-up visits as directed by your health care provider. This is important. As your condition improves, your dosage needs may change. You will need to have blood tests regularly so that your health care provider can watch your condition. Contact a health care provider if:  Your symptoms do not get better with treatment.  You are taking thyroid replacement medicine and: ? You sweat excessively. ? You have tremors. ? You feel anxious. ? You lose weight rapidly. ? You cannot tolerate heat. ? You have emotional swings. ? You have diarrhea. ? You feel weak. Get help right away if:  You develop chest pain.  You develop an irregular heartbeat.  You develop a rapid heartbeat. This information is not intended to replace advice given to you by your health care provider. Make sure you discuss any questions you have with your health care provider. Document Released: 04/22/2005  Document Revised: 09/28/2015 Document Reviewed: 09/07/2013 Elsevier Interactive Patient Education  2018 [employer].

## 2018-02-05 NOTE — Patient Instructions (Addendum)
Please continue the same levothyroxine.   I'll look for the thyroid ultrasound result next Wednesday.    Please come back for a follow-up appointment in 6 months.        Hypothyroidism Hypothyroidism is a disorder of the thyroid. The thyroid is a large gland that is located in the lower front of the neck. The thyroid releases hormones that control how the body works. With hypothyroidism, the thyroid does not make enough of these hormones. What are the causes? Causes of hypothyroidism may include:  Viral infections.  Pregnancy.  Your own defense system (immune system) attacking your thyroid.  Certain medicines.  Birth defects.  Past radiation treatments to your head or neck.  Past treatment with radioactive iodine.  Past surgical removal of part or all of your thyroid.  Problems with the gland that is located in the center of your brain (pituitary).  What are the signs or symptoms? Signs and symptoms of hypothyroidism may include:  Feeling as though you have no energy (lethargy).  Inability to tolerate cold.  Weight gain that is not explained by a change in diet or exercise habits.  Dry skin.  Coarse hair.  Menstrual irregularity.  Slowing of thought processes.  Constipation.  Sadness or depression.  How is this diagnosed? Your health care provider may diagnose hypothyroidism with blood tests and ultrasound tests. How is this treated? Hypothyroidism is treated with medicine that replaces the hormones that your body does not make. After you begin treatment, it may take several weeks for symptoms to go away. Follow these instructions at home:  Take medicines only as directed by your health care provider.  If you start taking any new medicines, tell your health care provider.  Keep all follow-up visits as directed by your health care provider. This is important. As your condition improves, your dosage needs may change. You will need to have blood tests  regularly so that your health care provider can watch your condition. Contact a health care provider if:  Your symptoms do not get better with treatment.  You are taking thyroid replacement medicine and: ? You sweat excessively. ? You have tremors. ? You feel anxious. ? You lose weight rapidly. ? You cannot tolerate heat. ? You have emotional swings. ? You have diarrhea. ? You feel weak. Get help right away if:  You develop chest pain.  You develop an irregular heartbeat.  You develop a rapid heartbeat. This information is not intended to replace advice given to you by your health care provider. Make sure you discuss any questions you have with your health care provider. Document Released: 04/22/2005 Document Revised: 09/28/2015 Document Reviewed: 09/07/2013 Elsevier Interactive Patient Education  2018 [employer].

## 2018-02-05 NOTE — Progress Notes (Signed)
Subjective:  Patient ID: Mary Knox, female    DOB: 06/29/1980  Age: 37 y.o. MRN: [id number]  CC: Arm Pain (L arm pain and wrist pain, tried OTC meds but its still painful, braced it on steering wheel 2 days ago and would like to make sure not fractured or anything)   Wrist Pain   The pain is present in the left wrist. This is a new problem. The current episode started yesterday. The problem occurs intermittently. The problem has been unchanged. The quality of the pain is described as aching and dull. Pertinent negatives include no fever, inability to bear weight, itching, joint locking, joint swelling, limited range of motion, numbness, stiffness or tingling. The symptoms are aggravated by activity. She has tried nothing for the symptoms. Family history does not include gout or rheumatoid arthritis. There is no history of diabetes, gout, osteoarthritis or rheumatoid arthritis.   Reviewed past Medical, Social and Family history today.  Outpatient Medications Prior to Visit  Medication Sig Dispense Refill  . albuterol (PROVENTIL HFA;VENTOLIN HFA) 108 (90 Base) MCG/ACT inhaler Inhale 2 puffs as needed into the lungs for wheezing or shortness of breath. 1 Inhaler 1  . ARIPiprazole (ABILIFY) 10 MG tablet Take 10 mg by mouth daily.    . Cholecalciferol (VITAMIN D) 2000 UNITS CAPS Take by mouth.    . lamoTRIgine (LAMICTAL) 200 MG tablet Take 200 mg by mouth daily.    Marland Kitchen levothyroxine (SYNTHROID, LEVOTHROID) 25 MCG tablet Take one (1) tablet 25 mcg QOD  Alternating with two (2) tablets 50 mcg QOD    . metFORMIN (GLUCOPHAGE-XR) 500 MG 24 hr tablet Take 2 (500 mg. ) tab. by mouth with breakfast and with supper every day.    . phentermine (ADIPEX-P) 37.5 MG tablet Take 1/2 tab.at breakfast and 1/2 tab. after lunch     No facility-administered medications prior to visit.     ROS See HPI  Objective:  BP 120/82 (BP Location: Right Arm, Patient Position: Sitting, Cuff Size: Large)   Pulse 74    Temp 98.2 F (36.8 C) (Oral)   Ht 5\' 7"  (1.702 m)   Wt 280 lb 3.2 oz (127.1 kg)   SpO2 96%   BMI 43.89 kg/m   BP Readings from Last 3 Encounters:  02/05/18 120/82  08/27/17 130/78  08/23/17 132/78    Wt Readings from Last 3 Encounters:  02/05/18 280 lb 3.2 oz (127.1 kg)  08/27/17 279 lb (126.6 kg)  07/02/17 280 lb 8 oz (127.2 kg)    Physical Exam  Constitutional: She is oriented to person, place, and time. She appears well-developed and well-nourished.  Cardiovascular: Normal rate.  Pulmonary/Chest: Effort normal.  Musculoskeletal: Normal range of motion. She exhibits no edema, tenderness or deformity.  Neurological: She is alert and oriented to person, place, and time.  Vitals reviewed.  Lab Results  Component Value Date   WBC 13.0 (H) 11/12/2012   HGB 14.6 11/12/2012   HCT 43.4 11/12/2012   PLT 334.0 11/12/2012   GLUCOSE 119 (H) 11/12/2012   CHOL 212 (H) 03/23/2009   TRIG 166.0 (H) 03/23/2009   HDL 45.30 03/23/2009   LDLDIRECT 145.7 03/23/2009   ALT 38 (H) 11/12/2012   AST 25 11/12/2012   NA 138 11/12/2012   K 3.6 11/12/2012   CL 105 11/12/2012   CREATININE 1.0 11/12/2012   BUN 11 11/12/2012   CO2 26 11/12/2012   TSH 1.81 11/12/2012   HGBA1C 5.6 11/12/2012    Dg Chest  2 View  Result Date: 05/16/2015 CLINICAL DATA:  Cough for 5 days.  On antibiotics. EXAM: CHEST  2 VIEW COMPARISON:  None FINDINGS: The heart size and mediastinal contours are within normal limits. Scar like density is noted in the left midlung. The visualized skeletal structures are unremarkable. IMPRESSION: No active cardiopulmonary disease. Electronically Signed   By: Kerby Moors M.D.   On: 05/16/2015 14:42    Assessment & Plan:   Mary Knox was seen today for arm pain.  Diagnoses and all orders for this visit:  Left wrist pain   I am having Mary Knox maintain her ARIPiprazole, lamoTRIgine, Vitamin D, metFORMIN, phentermine, levothyroxine, and albuterol.  No orders of the  defined types were placed in this encounter.   Follow-up: Return if symptoms worsen or fail to improve.  Wilfred Lacy, NP

## 2018-02-05 NOTE — Patient Instructions (Signed)
Ok to use biofreeze gel or aspercreme or lidocaine gel for pain.  Return to office if no improvement in 1-2weeks.  Wrist Pain, Adult There are many things that can cause wrist pain. Some common causes include:  An injury to the wrist area, such as a sprain, strain, or fracture.  Overuse of the joint.  A condition that causes increased pressure on a nerve in the wrist (carpal tunnel syndrome).  Wear and tear of the joints that occurs with aging (osteoarthritis).  A variety of other types of arthritis.  Sometimes, the cause of wrist pain is not known. Often, the pain goes away when you follow instructions from your health care provider for relieving pain at home, such as resting or icing the wrist. If your wrist pain continues, it is important to tell your health care provider. Follow these instructions at home:  Rest the wrist area for at least 48 hours or as long as told by your health care provider.  If a splint or elastic bandage has been applied, use it as told by your health care provider. ? Remove the splint or bandage only as told by your health care provider. ? Loosen the splint or bandage if your fingers tingle, become numb, or turn cold or blue.  If directed, apply ice to the injured area. ? If you have a removable splint or elastic bandage, remove it as told by your health care provider. ? Put ice in a plastic bag. ? Place a towel between your skin and the bag or between your splint or bandage and the bag. ? Leave the ice on for 20 minutes, 2-3 times a day.  Keep your arm raised (elevated) above the level of your heart while you are sitting or lying down.  Take over-the-counter and prescription medicines only as told by your health care provider.  Keep all follow-up visits as told by your health care provider. This is important. Contact a health care provider if:  You have a sudden sharp pain in the wrist, hand, or arm that is different or new.  The swelling or  bruising on your wrist or hand gets worse.  Your skin becomes red, gets a rash, or has open sores.  Your pain does not get better or it gets worse. Get help right away if:  You lose feeling in your fingers or hand.  Your fingers turn white, very red, or cold and blue.  You cannot move your fingers.  You have a fever or chills. This information is not intended to replace advice given to you by your health care provider. Make sure you discuss any questions you have with your health care provider. Document Released: 01/30/2005 Document Revised: 11/16/2015 Document Reviewed: 11/09/2015 Elsevier Interactive Patient Education  [employer].

## 2018-02-06 ENCOUNTER — Telehealth: Payer: Self-pay

## 2018-02-06 NOTE — Telephone Encounter (Signed)
Received fax from Dr. Awilda Metro office. Looks to be patients lab results. Placed on Dr. Rosario Adie desk

## 2018-02-07 DIAGNOSIS — E039 Hypothyroidism, unspecified: Secondary | ICD-10-CM | POA: Insufficient documentation

## 2018-02-10 ENCOUNTER — Ambulatory Visit: Payer: BC Managed Care – PPO | Admitting: Family Medicine

## 2018-02-13 ENCOUNTER — Telehealth: Payer: Self-pay | Admitting: *Deleted

## 2018-02-13 NOTE — Telephone Encounter (Signed)
Received Utrasound results from [facility]; forwarded to provider/SLS 10/11

## 2018-02-16 ENCOUNTER — Ambulatory Visit: Payer: BC Managed Care – PPO | Admitting: Family Medicine

## 2018-02-16 ENCOUNTER — Telehealth: Payer: Self-pay | Admitting: *Deleted

## 2018-02-16 ENCOUNTER — Encounter: Payer: Self-pay | Admitting: Family Medicine

## 2018-02-16 DIAGNOSIS — R748 Abnormal levels of other serum enzymes: Secondary | ICD-10-CM | POA: Insufficient documentation

## 2018-02-16 NOTE — Patient Instructions (Signed)
Fatty Liver Fatty liver, also called hepatic steatosis or steatohepatitis, is a condition in which too much fat has built up in your liver cells. The liver removes harmful substances from your bloodstream. It produces fluids your body needs. It also helps your body use and store energy from the food you eat. In many cases, fatty liver does not cause symptoms or problems. It is often diagnosed when tests are being done for other reasons. However, over time, fatty liver can cause inflammation that may lead to more serious liver problems, such as scarring of the liver (cirrhosis). What are the causes? Causes of fatty liver may include:  Drinking too much alcohol.  Poor nutrition.  Obesity.  Cushing syndrome.  Diabetes.  Hyperlipidemia.  Pregnancy.  Certain drugs.  Poisons.  Some viral infections.  What increases the risk? You may be more likely to develop fatty liver if you:  Abuse alcohol.  Are pregnant.  Are overweight.  Have diabetes.  Have hepatitis.  Have a high triglyceride level.  What are the signs or symptoms? Fatty liver often does not cause any symptoms. In cases where symptoms develop, they can include:  Fatigue.  Weakness.  Weight loss.  Confusion.  Abdominal pain.  Yellowing of your skin and the white parts of your eyes (jaundice).  Nausea and vomiting.  How is this diagnosed? Fatty liver may be diagnosed by:  Physical exam and medical history.  Blood tests.  Imaging tests, such as an ultrasound, CT scan, or MRI.  Liver biopsy. A small sample of liver tissue is removed using a needle. The sample is then looked at under a microscope.  How is this treated? Fatty liver is often caused by other health conditions. Treatment for fatty liver may involve medicines and lifestyle changes to manage conditions such as:  Alcoholism.  High cholesterol.  Diabetes.  Being overweight or obese.  Follow these instructions at home:  Eat a  healthy diet as directed by your health care provider.  Exercise regularly. This can help you lose weight and control your cholesterol and diabetes. Talk to your health care provider about an exercise plan and which activities are best for you.  Do not drink alcohol.  Take medicines only as directed by your health care provider. Contact a health care provider if: You have difficulty controlling your:  Blood sugar.  Cholesterol.  Alcohol consumption.  Get help right away if:  You have abdominal pain.  You have jaundice.  You have nausea and vomiting. This information is not intended to replace advice given to you by your health care provider. Make sure you discuss any questions you have with your health care provider. Document Released: 06/07/2005 Document Revised: 09/28/2015 Document Reviewed: 09/01/2013 Elsevier Interactive Patient Education  2018 [employer]  

## 2018-02-16 NOTE — Assessment & Plan Note (Signed)
Fatty liver on [place] Diet / weight loss will help this Repeat labs today Refer to healthy weight and wellness

## 2018-02-16 NOTE — Progress Notes (Signed)
Patient ID: BRITAINY KOZUB, female    DOB: 09-Oct-1980  Age: 37 y.o. MRN: [id number]    Subjective:  Subjective  HPI SHATOYA ROETS presents for f/u US and labs done by [facility].  She was found to have elevated liver enzymes and fatty liver.  They rx a med for her but she is hesitant to take it   She is here to have [place] review the labs and [place].    Review of Systems  Constitutional: Negative for chills and fever.  HENT: Negative for congestion and hearing loss.   Eyes: Negative for discharge.  Respiratory: Negative for cough and shortness of breath.   Cardiovascular: Negative for chest pain, palpitations and leg swelling.  Gastrointestinal: Negative for abdominal pain, blood in stool, constipation, diarrhea, nausea and vomiting.  Genitourinary: Negative for dysuria, frequency, hematuria and urgency.  Musculoskeletal: Negative for back pain and myalgias.  Skin: Negative for rash.  Allergic/Immunologic: Negative for environmental allergies.  Neurological: Negative for dizziness, weakness and headaches.  Hematological: Does not bruise/bleed easily.  Psychiatric/Behavioral: Negative for suicidal ideas. The patient is not nervous/anxious.     History Past Medical History:  Diagnosis Date  . Asthma   . Bipolar disorder ([place])   . Depression     She has a past surgical history that includes Wisdom tooth extraction.   Her family history includes Alcohol abuse in her father; Bipolar disorder in her father; Diabetes in her paternal grandfather; Hypertension in her unknown relative; Hypothyroidism in her mother; Parkinsonism in her unknown relative; Stroke in her paternal grandfather.She reports that she has never smoked. She has never used smokeless tobacco. She reports that she drinks alcohol. She reports that she does not use drugs.  Current Outpatient Medications on File Prior to Visit  Medication Sig Dispense Refill  . albuterol (PROVENTIL HFA;VENTOLIN HFA) 108 (90 Base) MCG/ACT  inhaler Inhale 2 puffs as needed into the lungs for wheezing or shortness of breath. 1 Inhaler 1  . ARIPiprazole (ABILIFY) 10 MG tablet Take 10 mg by mouth daily.    . Cholecalciferol (VITAMIN D3) 5000 units CAPS Take 1 capsule by mouth daily.    Marland Kitchen lamoTRIgine (LAMICTAL) 200 MG tablet Take 200 mg by mouth daily.    Marland Kitchen levothyroxine (SYNTHROID, LEVOTHROID) 50 MCG tablet Take by mouth.    . metFORMIN (GLUCOPHAGE-XR) 500 MG 24 hr tablet Take 2 (500 mg. ) tab. by mouth with breakfast and with supper every day.    . phentermine (ADIPEX-P) 37.5 MG tablet Take 1/2 tab.at breakfast and 1/2 tab. after lunch     No current facility-administered medications on file prior to visit.      Objective:  Objective  Physical Exam  Constitutional: She is oriented to person, place, and time. She appears well-developed and well-nourished.  HENT:  Head: Normocephalic and atraumatic.  Eyes: Conjunctivae and EOM are normal.  Neck: Normal range of motion. Neck supple. No JVD present. Carotid bruit is not present. No thyromegaly present.  Cardiovascular: Normal rate, regular rhythm and normal heart sounds.  No murmur heard. Pulmonary/Chest: Effort normal and breath sounds normal. No respiratory distress. She has no wheezes. She has no rales. She exhibits no tenderness.  Abdominal: Soft. She exhibits no distension and no mass. There is no tenderness. There is no rebound and no guarding.  Musculoskeletal: She exhibits no edema.  Neurological: She is alert and oriented to person, place, and time.  Psychiatric: She has a normal mood and affect.  Nursing note and  vitals reviewed.  BP 130/75 (BP Location: Right Arm, Cuff Size: Large)   Pulse 73   Temp 99.4 F (37.4 C) (Oral)   Resp 16   Ht 5\' 7"  (1.702 m)   Wt 281 lb (127.5 kg)   LMP 01/19/2018   SpO2 98%   BMI 44.01 kg/m  Wt Readings from Last 3 Encounters:  02/16/18 281 lb (127.5 kg)  02/05/18 280 lb 3.2 oz (127.1 kg)  02/05/18 280 lb 3.2 oz (127.1 kg)      Lab Results  Component Value Date   WBC 13.0 (H) 11/12/2012   HGB 14.6 11/12/2012   HCT 43.4 11/12/2012   PLT 334.0 11/12/2012   GLUCOSE 119 (H) 11/12/2012   CHOL 212 (H) 03/23/2009   TRIG 166.0 (H) 03/23/2009   HDL 45.30 03/23/2009   LDLDIRECT 145.7 03/23/2009   ALT 38 (H) 11/12/2012   AST 25 11/12/2012   NA 138 11/12/2012   K 3.6 11/12/2012   CL 105 11/12/2012   CREATININE 1.0 11/12/2012   BUN 11 11/12/2012   CO2 26 11/12/2012   TSH 1.81 11/12/2012   HGBA1C 5.6 11/12/2012    Dg Chest 2 View  Result Date: 05/16/2015 CLINICAL DATA:  Cough for 5 days.  On antibiotics. EXAM: CHEST  2 VIEW COMPARISON:  None FINDINGS: The heart size and mediastinal contours are within normal limits. Scar like density is noted in the left midlung. The visualized skeletal structures are unremarkable. IMPRESSION: No active cardiopulmonary disease. Electronically Signed   By: Kerby Moors M.D.   On: 05/16/2015 14:42     Assessment & Plan:  Plan  I have discontinued Suzie P. Macgowan's Vitamin D (Ergocalciferol). I am also having her maintain her ARIPiprazole, lamoTRIgine, metFORMIN, phentermine, albuterol, levothyroxine, and Vitamin D3.  No orders of the defined types were placed in this encounter.   Problem List Items Addressed This Visit      Unprioritized   Elevated liver enzymes    Fatty liver on [place] Diet / weight loss will help this Repeat labs today Refer to healthy weight and wellness      Relevant Orders   Comprehensive metabolic panel    Other Visit Diagnoses    Morbid obesity ([place])    -  Primary   Relevant Orders   Amb Ref to Medical Weight Management      Follow-up: Return if symptoms worsen or fail to improve.  Ann Held, DO

## 2018-02-16 NOTE — Telephone Encounter (Signed)
Received US Thyroid results from [facility]; forwarded to provider/SLS 10/14

## 2018-02-17 LAB — COMPREHENSIVE METABOLIC PANEL
ALBUMIN: 4.3 g/dL (ref 3.5–5.2)
ALK PHOS: 119 U/L — AB (ref 39–117)
ALT: 28 U/L (ref 0–35)
AST: 14 U/L (ref 0–37)
BUN: 15 mg/dL (ref 6–23)
CALCIUM: 10 mg/dL (ref 8.4–10.5)
CO2: 31 mEq/L (ref 19–32)
CREATININE: 0.97 mg/dL (ref 0.40–1.20)
Chloride: 100 mEq/L (ref 96–112)
GFR: 67.88 mL/min (ref >60.00)
Glucose, Bld: 95 mg/dL (ref 70–99)
Potassium: 3.9 mEq/L (ref 3.5–5.1)
Sodium: 138 mEq/L (ref 135–145)
Total Bilirubin: 0.3 mg/dL (ref 0.2–1.2)
Total Protein: 6.9 g/dL (ref 6.0–8.3)

## 2018-03-16 ENCOUNTER — Ambulatory Visit: Payer: BC Managed Care – PPO

## 2018-03-17 ENCOUNTER — Encounter (INDEPENDENT_AMBULATORY_CARE_PROVIDER_SITE_OTHER): Payer: Self-pay

## 2018-03-17 ENCOUNTER — Ambulatory Visit (INDEPENDENT_AMBULATORY_CARE_PROVIDER_SITE_OTHER): Payer: BC Managed Care – PPO

## 2018-03-17 DIAGNOSIS — Z23 Encounter for immunization: Secondary | ICD-10-CM

## 2018-03-24 ENCOUNTER — Ambulatory Visit (INDEPENDENT_AMBULATORY_CARE_PROVIDER_SITE_OTHER): Payer: BC Managed Care – PPO | Admitting: Bariatrics

## 2018-03-24 ENCOUNTER — Encounter (INDEPENDENT_AMBULATORY_CARE_PROVIDER_SITE_OTHER): Payer: Self-pay | Admitting: Bariatrics

## 2018-03-24 VITALS — BP 114/79 | HR 65 | Temp 98.6°F | Ht 67.0 in | Wt 278.0 lb

## 2018-03-24 DIAGNOSIS — E538 Deficiency of other specified B group vitamins: Secondary | ICD-10-CM | POA: Diagnosis not present

## 2018-03-24 DIAGNOSIS — Z0289 Encounter for other administrative examinations: Secondary | ICD-10-CM

## 2018-03-24 DIAGNOSIS — Z6841 Body Mass Index (BMI) 40.0 and over, adult: Secondary | ICD-10-CM

## 2018-03-24 DIAGNOSIS — R5383 Other fatigue: Secondary | ICD-10-CM | POA: Diagnosis not present

## 2018-03-24 DIAGNOSIS — Z1331 Encounter for screening for depression: Secondary | ICD-10-CM | POA: Diagnosis not present

## 2018-03-24 DIAGNOSIS — E282 Polycystic ovarian syndrome: Secondary | ICD-10-CM | POA: Diagnosis not present

## 2018-03-24 DIAGNOSIS — R0602 Shortness of breath: Secondary | ICD-10-CM | POA: Diagnosis not present

## 2018-03-24 DIAGNOSIS — Z9189 Other specified personal risk factors, not elsewhere classified: Secondary | ICD-10-CM

## 2018-03-24 DIAGNOSIS — E559 Vitamin D deficiency, unspecified: Secondary | ICD-10-CM | POA: Diagnosis not present

## 2018-03-24 DIAGNOSIS — E063 Autoimmune thyroiditis: Secondary | ICD-10-CM

## 2018-03-25 LAB — CBC WITH DIFFERENTIAL
BASOS: 1 %
Basophils Absolute: 0.1 10*3/uL (ref 0.0–0.2)
EOS (ABSOLUTE): 0.6 10*3/uL — ABNORMAL HIGH (ref 0.0–0.4)
EOS: 5 %
HEMATOCRIT: 40 % (ref 34.0–46.6)
Hemoglobin: 14.1 g/dL (ref 11.1–15.9)
Immature Grans (Abs): 0 10*3/uL (ref 0.0–0.1)
Immature Granulocytes: 0 %
Lymphocytes Absolute: 3.8 10*3/uL — ABNORMAL HIGH (ref 0.7–3.1)
Lymphs: 31 %
MCH: 31.6 pg (ref 26.6–33.0)
MCHC: 35.3 g/dL (ref 31.5–35.7)
MCV: 90 fL (ref 79–97)
MONOS ABS: 0.9 10*3/uL (ref 0.1–0.9)
Monocytes: 7 %
Neutrophils Absolute: 7 10*3/uL (ref 1.4–7.0)
Neutrophils: 56 %
RBC: 4.46 x10E6/uL (ref 3.77–5.28)
RDW: 12.8 % (ref 12.3–15.4)
WBC: 12.2 10*3/uL — ABNORMAL HIGH (ref 3.4–10.8)

## 2018-03-25 LAB — HEMOGLOBIN A1C
Est. average glucose Bld gHb Est-mCnc: 114 mg/dL
Hgb A1c MFr Bld: 5.6 % (ref 4.8–5.6)

## 2018-03-25 LAB — INSULIN, RANDOM: INSULIN: 47.8 u[IU]/mL — ABNORMAL HIGH (ref 2.6–24.9)

## 2018-03-25 LAB — VITAMIN D 25 HYDROXY (VIT D DEFICIENCY, FRACTURES): Vit D, 25-Hydroxy: 41.2 ng/mL (ref 30.0–100.0)

## 2018-03-27 ENCOUNTER — Encounter: Payer: Self-pay | Admitting: Neurology

## 2018-03-27 ENCOUNTER — Ambulatory Visit: Payer: BC Managed Care – PPO | Admitting: Family Medicine

## 2018-03-27 VITALS — BP 136/62 | HR 78 | Temp 98.3°F | Resp 16 | Ht 67.0 in | Wt 286.4 lb

## 2018-03-27 DIAGNOSIS — J4520 Mild intermittent asthma, uncomplicated: Secondary | ICD-10-CM

## 2018-03-27 DIAGNOSIS — R202 Paresthesia of skin: Secondary | ICD-10-CM

## 2018-03-27 DIAGNOSIS — R200 Anesthesia of skin: Secondary | ICD-10-CM | POA: Diagnosis not present

## 2018-03-27 LAB — VITAMIN B12: VITAMIN B 12: 563 pg/mL (ref 211–911)

## 2018-03-27 MED ORDER — ALBUTEROL SULFATE HFA 108 (90 BASE) MCG/ACT IN AERS: 2.0000 | INHALATION_SPRAY | RESPIRATORY_TRACT | 1 refills | Status: DC | PRN

## 2018-03-27 NOTE — Patient Instructions (Signed)
Paresthesia Paresthesia is an abnormal burning or prickling sensation. This sensation is generally felt in the hands, arms, legs, or feet. However, it may occur in any part of the body. Usually, it is not painful. The feeling may be described as:  Tingling or numbness.  Pins and needles.  Skin crawling.  Buzzing.  Limbs falling asleep.  Itching.  Most people experience temporary (transient) paresthesia at some time in their lives. Paresthesia may occur when you breathe too quickly (hyperventilation). It can also occur without any apparent cause. Commonly, paresthesia occurs when pressure is placed on a nerve. The sensation quickly goes away after the pressure is removed. For some people, however, paresthesia is a long-lasting (chronic) condition that is caused by an underlying disorder. If you continue to have paresthesia, you may need further medical evaluation. Follow these instructions at home: Watch your condition for any changes. Taking the following actions may help to lessen any discomfort that you are feeling:  Avoid drinking alcohol.  Try acupuncture or massage to help relieve your symptoms.  Keep all follow-up visits as directed by your health care provider. This is important.  Contact a health care provider if:  You continue to have episodes of paresthesia.  Your burning or prickling feeling gets worse when you walk.  You have pain, cramps, or dizziness.  You develop a rash. Get help right away if:  You feel weak.  You have trouble walking or moving.  You have problems with speech, understanding, or vision.  You feel confused.  You cannot control your bladder or bowel movements.  You have numbness after an injury.  You faint. This information is not intended to replace advice given to you by your health care provider. Make sure you discuss any questions you have with your health care provider. Document Released: 04/12/2002 Document Revised: 09/28/2015  Document Reviewed: 04/18/2014 Elsevier Interactive Patient Education  [employer].

## 2018-03-27 NOTE — Progress Notes (Signed)
Patient ID: Mary Knox, female    DOB: [birth year]  Age: 37 y.o. MRN: [id number]    Subjective:  Subjective  HPI ALANDRIA BUTKIEWICZ presents for numbness and tingling in both hands --usually only when her hands are above the heart -- its been going on for years but for the last month or 2 it has been getting worse  Her psych thought it may be the lamictal -- because it can deplete b12-- but her b12 level was 748.    Review of Systems  Constitutional: Negative for chills and fever.  HENT: Negative for congestion and hearing loss.   Eyes: Negative for discharge.  Respiratory: Negative for cough and shortness of breath.   Cardiovascular: Negative for chest pain, palpitations and leg swelling.  Gastrointestinal: Negative for abdominal pain, blood in stool, constipation, diarrhea, nausea and vomiting.  Genitourinary: Negative for dysuria, frequency, hematuria and urgency.  Musculoskeletal: Negative for back pain and myalgias.  Skin: Negative for rash.  Allergic/Immunologic: Negative for environmental allergies.  Neurological: Positive for numbness. Negative for dizziness, weakness and headaches.  Hematological: Does not bruise/bleed easily.  Psychiatric/Behavioral: Negative for suicidal ideas. The patient is not nervous/anxious.     History Past Medical History:  Diagnosis Date  . Anxiety   . Asthma   . Asthma   . B12 deficiency   . Back pain   . Bipolar 1 disorder ([place])   . Bipolar disorder ([place])   . Constipation   . Depression   . Depression   . Fatty liver   . Hashimoto's disease   . Lactose intolerance   . Overweight   . PCOS (polycystic ovarian syndrome)     She has a past surgical history that includes Wisdom tooth extraction.   Her family history includes Alcohol abuse in her father; Bipolar disorder in her father; Diabetes in her paternal grandfather; Heart disease in her father; High blood pressure in her father; Hypertension in her unknown relative; Hypothyroidism in  her mother; Obesity in her father and mother; Parkinsonism in her unknown relative; Sleep apnea in her father; Stroke in her paternal grandfather.She reports that she has never smoked. She has never used smokeless tobacco. She reports that she drinks alcohol. She reports that she does not use drugs.  Current Outpatient Medications on File Prior to Visit  Medication Sig Dispense Refill  . ARIPiprazole (ABILIFY) 10 MG tablet Take 10 mg by mouth daily.    . Cholecalciferol (VITAMIN D3) 5000 units CAPS Take 1 capsule by mouth daily.    Marland Kitchen lamoTRIgine (LAMICTAL) 200 MG tablet Take 200 mg by mouth daily.    Marland Kitchen levothyroxine (SYNTHROID, LEVOTHROID) 50 MCG tablet Take by mouth.    . metFORMIN (GLUCOPHAGE-XR) 500 MG 24 hr tablet Take 2 (500 mg. ) tab. by mouth with breakfast and with supper every day.     No current facility-administered medications on file prior to visit.      Objective:  Objective  Physical Exam  Constitutional: She is oriented to person, place, and time. She appears well-developed and well-nourished.  HENT:  Head: Normocephalic and atraumatic.  Eyes: Conjunctivae and EOM are normal.  Neck: Normal range of motion. Neck supple. No JVD present. Carotid bruit is not present. No thyromegaly present.  Cardiovascular: Normal rate, regular rhythm and normal heart sounds.  No murmur heard. Pulmonary/Chest: Effort normal and breath sounds normal. No respiratory distress. She has no wheezes. She has no rales. She exhibits no tenderness.  Musculoskeletal: She exhibits no edema.  Neurological: She is alert and oriented to person, place, and time. A sensory deficit is present.  Numbness and tingling both hands worsen with flexing and per pt when hands are above the heart   Psychiatric: She has a normal mood and affect.  Nursing note and vitals reviewed.  BP 136/62 (BP Location: Left Arm, Cuff Size: Large)   Pulse 78   Temp 98.3 F (36.8 C) (Oral)   Resp 16   Ht 5\' 7"  (1.702 m)   Wt 286  lb 6.4 oz (129.9 kg)   LMP 03/27/2018   SpO2 99%   BMI 44.86 kg/m  Wt Readings from Last 3 Encounters:  03/27/18 286 lb 6.4 oz (129.9 kg)  03/24/18 278 lb (126.1 kg)  02/16/18 281 lb (127.5 kg)     Lab Results  Component Value Date   WBC 12.2 (H) 03/24/2018   HGB 14.1 03/24/2018   HCT 40.0 03/24/2018   PLT 334.0 11/12/2012   GLUCOSE 95 02/16/2018   CHOL 212 (H) 03/23/2009   TRIG 166.0 (H) 03/23/2009   HDL 45.30 03/23/2009   LDLDIRECT 145.7 03/23/2009   ALT 28 02/16/2018   AST 14 02/16/2018   NA 138 02/16/2018   K 3.9 02/16/2018   CL 100 02/16/2018   CREATININE 0.97 02/16/2018   BUN 15 02/16/2018   CO2 31 02/16/2018   TSH 1.81 11/12/2012   HGBA1C 5.6 03/24/2018    Dg Chest 2 View  Result Date: 05/16/2015 CLINICAL DATA:  Cough for 5 days.  On antibiotics. EXAM: CHEST  2 VIEW COMPARISON:  None FINDINGS: The heart size and mediastinal contours are within normal limits. Scar like density is noted in the left midlung. The visualized skeletal structures are unremarkable. IMPRESSION: No active cardiopulmonary disease. Electronically Signed   By: Kerby Moors M.D.   On: 05/16/2015 14:42     Assessment & Plan:  Plan  I have discontinued Allaya P. Elahi's phentermine. I have also changed her albuterol. Additionally, I am having her maintain her ARIPiprazole, lamoTRIgine, metFORMIN, levothyroxine, and Vitamin D3.  Meds ordered this encounter  Medications  . albuterol (PROVENTIL HFA;VENTOLIN HFA) 108 (90 Base) MCG/ACT inhaler    Sig: Inhale 2 puffs into the lungs as needed for wheezing or shortness of breath.    Dispense:  1 Inhaler    Refill:  1    Problem List Items Addressed This Visit      Unprioritized   Asthma - Primary   Relevant Medications   albuterol (PROVENTIL HFA;VENTOLIN HFA) 108 (90 Base) MCG/ACT inhaler    Other Visit Diagnoses    Numbness and tingling in both hands       Relevant Orders   Ambulatory referral to Neurology   Vitamin B12   Thyroid  Panel With TSH   Thyroid peroxidase antibody      Follow-up: Return if symptoms worsen or fail to improve.  Ann Held, DO

## 2018-03-29 ENCOUNTER — Encounter: Payer: Self-pay | Admitting: Family Medicine

## 2018-03-30 DIAGNOSIS — Z6841 Body Mass Index (BMI) 40.0 and over, adult: Secondary | ICD-10-CM

## 2018-03-30 LAB — THYROID PANEL WITH TSH
Free Thyroxine Index: 2 (ref 1.4–3.8)
T3 UPTAKE: 29 % (ref 22–35)
T4, Total: 6.9 ug/dL (ref 5.1–11.9)
TSH: 1.8 m[IU]/L

## 2018-03-30 LAB — THYROID PEROXIDASE ANTIBODY: Thyroperoxidase Ab SerPl-aCnc: 20 IU/mL — ABNORMAL HIGH (ref <9)

## 2018-03-30 NOTE — Progress Notes (Signed)
Office: [phone]  /  Fax: [phone]   Dear Dr. Carollee Herter,   Thank you for referring [name] to our clinic. The following note includes my evaluation and treatment recommendations.  HPI:   Chief Complaint: OBESITY    Mary Knox has been referred by Ann Held, DO for consultation regarding her obesity and obesity related comorbidities.    AMI MALLY (MR# [id number]) is a 37 y.o. female who presents on 03/30/2018 for obesity evaluation and treatment. Current BMI is Body mass index is 43.54 kg/m.Marland Kitchen Tangy has been struggling with her weight for many years and has been unsuccessful in either losing weight, maintaining weight loss, or reaching her healthy weight goal.     Nevin Bloodgood attended our information session and states she is currently in the action stage of change and ready to dedicate time achieving and maintaining a healthier weight. Marquite is interested in becoming our patient and working on intensive lifestyle modifications including (but not limited to) diet, exercise and weight loss.    Khamora states her family eats meals together she thinks her family will eat healthier with  her her desired weight loss is 115 lbs she started gaining weight 15 yrs ago her heaviest weight ever was 275 lbs. she is lactose intolerant she states she craves sweets she states she does drink protein smoothies she frequently makes poor food choices she struggles with emotional eating  she states her obstacles to cooking, are being tired.   Fatigue Veola feels her energy is lower than it should be. This has worsened with weight gain and has not worsened recently. Brittannie admits to daytime somnolence and she denies waking up still tired. Patient is at risk for obstructive sleep apnea. Patent has a history of symptoms of daytime fatigue. Patient generally gets 9 hours of sleep per night, and states they generally have generally restful sleep. Snoring is present. Apneic episodes  are not present. Epworth Sleepiness Score is 2  Dyspnea on exertion Nevin Bloodgood notes increasing shortness of breath with exercising and seems to be worsening over time with weight gain. She notes getting out of breath sooner with activity than she used to. Britney has a history of asthma (exercise induced) and she does not use a rescue inhaler. This has not gotten worse recently. Cylinda denies orthopnea.  PCOS (polycystic ovarian syndrome) K[place]stan has a history of PCOS and she is not taking metformin (has prescription). Her last Hgb A1c was > 5.0 per patient.  Vitamin D deficiency Rhylynn has a diagnosis of vitamin D deficiency. She is currently taking OTC vit D and denies nausea, vomiting or muscle weakness.  At risk for osteopenia and osteoporosis Erine is at higher risk of osteopenia and osteoporosis due to vitamin D deficiency.   B12 Deficiency Sussie has a diagnosis of B12 insufficiency and notes fatigue. This is not a new diagnosis. Aibhlinn is not a vegetarian and does have a previous diagnosis of anemia. She does not have a history of weight loss surgery. Adonna denies paresthesias.  Hashimoto's (per patient) Ceaira reports a history of Hashimoto's and she is taking levothyroxine. Her last TSH was at 2.750 June 2019 and indices was normal.    Depression Screen Shakeela's Food and Mood (modified PHQ-9) score was  Depression screen PHQ 2/9 03/24/2018  Decreased Interest 3  Down, Depressed, Hopeless 2  PHQ - 2 Score 5  Altered sleeping 0  Tired, decreased energy 3  Change in appetite 0  Feeling bad or failure about yourself  0  Trouble concentrating 0  Moving slowly or fidgety/restless 2  Suicidal thoughts 0  PHQ-9 Score 10  Difficult doing work/chores Somewhat difficult    ALLERGIES: No Known Allergies  MEDICATIONS: Current Outpatient Medications on File Prior to Visit  Medication Sig Dispense Refill  . ARIPiprazole (ABILIFY) 10 MG tablet Take 10 mg by mouth daily.    . Cholecalciferol  (VITAMIN D3) 5000 units CAPS Take 1 capsule by mouth daily.    Marland Kitchen lamoTRIgine (LAMICTAL) 200 MG tablet Take 200 mg by mouth daily.    Marland Kitchen levothyroxine (SYNTHROID, LEVOTHROID) 50 MCG tablet Take by mouth.    . metFORMIN (GLUCOPHAGE-XR) 500 MG 24 hr tablet Take 2 (500 mg. ) tab. by mouth with breakfast and with supper every day.     No current facility-administered medications on file prior to visit.     PAST MEDICAL HISTORY: Past Medical History:  Diagnosis Date  . Anxiety   . Asthma   . Asthma   . B12 deficiency   . Back pain   . Bipolar 1 disorder ([place])   . Bipolar disorder ([place])   . Constipation   . Depression   . Depression   . Fatty liver   . Hashimoto's disease   . Lactose intolerance   . Overweight   . PCOS (polycystic ovarian syndrome)     PAST SURGICAL HISTORY: Past Surgical History:  Procedure Laterality Date  . WISDOM TOOTH EXTRACTION      SOCIAL HISTORY: Social History   Tobacco Use  . Smoking status: Never Smoker  . Smokeless tobacco: Never Used  Substance Use Topics  . Alcohol use: Yes    Comment:  rarely  . Drug use: No    FAMILY HISTORY: Family History  Problem Relation Age of Onset  . Alcohol abuse Father   . Bipolar disorder Father        also OCD  . Obesity Father   . Sleep apnea Father   . Heart disease Father   . High blood pressure Father   . Diabetes Paternal Grandfather   . Stroke Paternal Grandfather   . Hypertension Unknown   . Parkinsonism Unknown   . Hypothyroidism Mother   . Obesity Mother     ROS: Review of Systems  Constitutional: Positive for malaise/fatigue.  Eyes:       + Wear Glasses or Contacts + Floaters  Respiratory: Positive for shortness of breath (with activity).   Cardiovascular: Negative for orthopnea.  Gastrointestinal: Positive for heartburn. Negative for nausea and vomiting.  Musculoskeletal: Positive for back pain.       Negative for muscle weakness  Skin:       + Dryness   Neurological: Negative  for tingling.  Psychiatric/Behavioral: Positive for depression.    PHYSICAL EXAM: Blood pressure 114/79, pulse 65, temperature 98.6 F (37 C), temperature source Oral, height 5\' 7"  (1.702 m), weight 278 lb (126.1 kg), last menstrual period 02/17/2018, SpO2 96 %. Body mass index is 43.54 kg/m. Physical Exam  Constitutional: She is oriented to person, place, and time. She appears well-developed and well-nourished.  HENT:  Head: Normocephalic and atraumatic.  Nose: Nose normal.  Mallampati = 3  Eyes: EOM are normal. No scleral icterus.  Neck: Normal range of motion. Neck supple. No thyromegaly present.  Cardiovascular: Normal rate and regular rhythm.  Pulmonary/Chest: Effort normal. No respiratory distress.  Abdominal: Soft. There is no tenderness.  + Obesity  Musculoskeletal: Normal range of motion. She exhibits edema (trace edema bilateral  lower extremities).  Range of Motion normal in all 4 extremities  Neurological: She is alert and oriented to person, place, and time. Coordination normal.  Skin: Skin is warm and dry.  Psychiatric: She has a normal mood and affect.  Vitals reviewed.   RECENT LABS AND TESTS: BMET    Component Value Date/Time   NA 138 02/16/2018 1605   K 3.9 02/16/2018 1605   CL 100 02/16/2018 1605   CO2 31 02/16/2018 1605   GLUCOSE 95 02/16/2018 1605   BUN 15 02/16/2018 1605   CREATININE 0.97 02/16/2018 1605   CALCIUM 10.0 02/16/2018 1605   GFRNONAA 77.98 03/23/2009 1454   Lab Results  Component Value Date   HGBA1C 5.6 03/24/2018   Lab Results  Component Value Date   INSULIN 47.8 (H) 03/24/2018   CBC    Component Value Date/Time   WBC 12.2 (H) 03/24/2018 1009   WBC 13.0 (H) 11/12/2012 1435   RBC 4.46 03/24/2018 1009   RBC 4.64 11/12/2012 1435   HGB 14.1 03/24/2018 1009   HCT 40.0 03/24/2018 1009   PLT 334.0 11/12/2012 1435   MCV 90 03/24/2018 1009   MCH 31.6 03/24/2018 1009   MCH 30.8 03/29/2011 1348   MCHC 35.3 03/24/2018 1009   MCHC  33.8 11/12/2012 1435   RDW 12.8 03/24/2018 1009   LYMPHSABS 3.8 (H) 03/24/2018 1009   MONOABS 0.8 11/12/2012 1435   EOSABS 0.6 (H) 03/24/2018 1009   BASOSABS 0.1 03/24/2018 1009   Iron/TIBC/Ferritin/ %Sat No results found for: IRON, TIBC, FERRITIN, IRONPCTSAT Lipid Panel     Component Value Date/Time   CHOL 212 (H) 03/23/2009 1454   TRIG 166.0 (H) 03/23/2009 1454   HDL 45.30 03/23/2009 1454   CHOLHDL 5 03/23/2009 1454   VLDL 33.2 03/23/2009 1454   LDLDIRECT 145.7 03/23/2009 1454   Hepatic Function Panel     Component Value Date/Time   PROT 6.9 02/16/2018 1605   ALBUMIN 4.3 02/16/2018 1605   AST 14 02/16/2018 1605   ALT 28 02/16/2018 1605   ALKPHOS 119 (H) 02/16/2018 1605   BILITOT 0.3 02/16/2018 1605   BILIDIR 0.0 11/12/2012 1435      Component Value Date/Time   TSH 1.80 03/27/2018 0952   TSH 1.81 11/12/2012 1435   TSH 2.61 03/23/2009 1454    ECG  shows NSR with a rate of 70 BPM INDIRECT CALORIMETER done today shows a VO2 of 255 and a REE of 1778.  Her calculated basal metabolic rate is 0786 thus her basal metabolic rate is worse than expected.    ASSESSMENT AND PLAN: Other fatigue - Plan: EKG 12-Lead, CBC With Differential, Hemoglobin A1c, Insulin, random  SOB (shortness of breath) on exertion - Plan: CBC With Differential, Hemoglobin A1c, Insulin, random  PCOS (polycystic ovarian syndrome) - Plan: Hemoglobin A1c, Insulin, random  Vitamin D deficiency - Plan: VITAMIN D 25 Hydroxy (Vit-D Deficiency, Fractures)  B12 deficiency  Hashimoto's disease  Depression screening  At risk for osteoporosis  Class 3 severe obesity with serious comorbidity and body mass index (BMI) of 40.0 to 44.9 in adult, unspecified obesity type ([place])  PLAN: Fatigue Una was informed that her fatigue may be related to obesity, depression or many other causes. Labs will be ordered, and in the meanwhile Evonda has agreed to work on diet, exercise and weight loss to help with fatigue.  Proper sleep hygiene was discussed including the need for 7-8 hours of quality sleep each night. A sleep study was not ordered based  on symptoms and Epworth score.  Dyspnea on exertion Maisa's shortness of breath appears to be obesity related and exercise induced. She has agreed to work on weight loss and gradually increase exercise in frequency and duration to treat her exercise induced shortness of breath. If Galileah follows our instructions and loses weight without improvement of her shortness of breath, we will plan to refer to pulmonology. We will monitor this condition regularly. Itzayanna agrees to this plan.  PCOS (polycystic ovarian syndrome) We discussed metformin and may begin in the future. Kirbie will work on decreasing simple carbohydrates and increasing lean protein in her diet.  Vitamin D Deficiency Zlata was informed that low vitamin D levels contributes to fatigue and are associated with obesity, breast, and colon cancer. She will continue to take OTC vitamin D and will follow up for routine testing of vitamin D, at least 2-3 times per year. She was informed of the risk of over-replacement of vitamin D and agrees to not increase her dose unless she discusses this with [place] first. We will check vitamin D level today and Madia will follow up with our clinic in 2 weeks.  At risk for osteopenia and osteoporosis Latravia was given extended  (15 minutes) osteoporosis prevention counseling today. Tyffany is at risk for osteopenia and osteoporosis due to her vitamin D deficiency. She was encouraged to take her vitamin D and follow her higher calcium diet and increase strengthening exercise to help strengthen her bones and decrease her risk of osteopenia and osteoporosis.  B12 Deficiency Raesha will work on increasing B12 rich foods in her diet. B12 supplementation was not prescribed today. We will check B12 level today and Karley will follow up with our clinic in 2 weeks.   Hashimoto's (per  patient) Estelene will continue levothyroxine and follow up with her provider. Parneet will follow up with our clinic in 2 weeks.  Depression Screen Mar had a moderately positive depression screening. Depression is commonly associated with obesity and often results in emotional eating behaviors. We will monitor this closely and work on CBT to help improve the non-hunger eating patterns. Referral to Psychology may be required if no improvement is seen as she continues in our clinic.  Obesity Baila is currently in the action stage of change and her goal is to continue with weight loss efforts. I recommend Laquilla begin the structured treatment plan as follows:  She has agreed to follow the Category 2 plan Esmay has been instructed to eventually work up to a goal of 150 minutes of combined cardio and strengthening exercise per week for weight loss and overall health benefits. We discussed the following Behavioral Modification Strategies today: increase H2O intake, keeping healthy foods in the home, increasing lean protein intake, decreasing simple carbohydrates, increasing vegetables, decrease eating out, work on meal planning and easy cooking plans and holiday eating strategies    She was informed of the importance of frequent follow up visits to maximize her success with intensive lifestyle modifications for her multiple health conditions. She was informed we would discuss her lab results at her next visit unless there is a critical issue that needs to be addressed sooner. Charlett agreed to keep her next visit at the agreed upon time to discuss these results.    OBESITY BEHAVIORAL INTERVENTION VISIT  Today's visit was # 1   Starting weight: 278 lbs Starting date: 03/24/2018 Today's weight : 278 lbs  Today's date: 03/24/2018 Total lbs lost to date: 0   ASK: We discussed  the diagnosis of obesity with [name] today and Riann agreed to give [place] permission to discuss obesity behavioral  modification therapy today.  ASSESS: Paislea has the diagnosis of obesity and her BMI today is 43.53 Diya is in the action stage of change   ADVISE: Lejla was educated on the multiple health risks of obesity as well as the benefit of weight loss to improve her health. She was advised of the need for long term treatment and the importance of lifestyle modifications to improve her current health and to decrease her risk of future health problems.  AGREE: Multiple dietary modification options and treatment options were discussed and  Ura agreed to follow the recommendations documented in the above note.  ARRANGE: Ozell was educated on the importance of frequent visits to treat obesity as outlined per CMS and USPSTF guidelines and agreed to schedule her next follow up appointment today.  Corey Skains, am acting as Location manager for [employer]. Owens Shark, DO  I have reviewed the above documentation for accuracy and completeness, and I agree with the above. -Jearld Lesch, DO

## 2018-03-31 ENCOUNTER — Other Ambulatory Visit: Payer: Self-pay | Admitting: Family Medicine

## 2018-03-31 DIAGNOSIS — R7989 Other specified abnormal findings of blood chemistry: Secondary | ICD-10-CM

## 2018-04-07 ENCOUNTER — Encounter (INDEPENDENT_AMBULATORY_CARE_PROVIDER_SITE_OTHER): Payer: Self-pay | Admitting: Bariatrics

## 2018-04-07 ENCOUNTER — Ambulatory Visit (INDEPENDENT_AMBULATORY_CARE_PROVIDER_SITE_OTHER): Payer: BC Managed Care – PPO | Admitting: Bariatrics

## 2018-04-07 VITALS — BP 123/76 | HR 96 | Temp 97.4°F | Ht 67.0 in | Wt 278.0 lb

## 2018-04-07 DIAGNOSIS — Z6841 Body Mass Index (BMI) 40.0 and over, adult: Secondary | ICD-10-CM | POA: Diagnosis not present

## 2018-04-07 DIAGNOSIS — Z9189 Other specified personal risk factors, not elsewhere classified: Secondary | ICD-10-CM | POA: Diagnosis not present

## 2018-04-07 DIAGNOSIS — E282 Polycystic ovarian syndrome: Secondary | ICD-10-CM | POA: Diagnosis not present

## 2018-04-07 DIAGNOSIS — E039 Hypothyroidism, unspecified: Secondary | ICD-10-CM

## 2018-04-07 DIAGNOSIS — E8881 Metabolic syndrome: Secondary | ICD-10-CM | POA: Diagnosis not present

## 2018-04-07 MED ORDER — METFORMIN HCL 500 MG PO TABS: 500.0000 mg | ORAL_TABLET | Freq: Every day | ORAL | 0 refills | Status: AC

## 2018-04-09 NOTE — Progress Notes (Signed)
Office: [phone]  /  Fax: [phone]   HPI:   Chief Complaint: OBESITY Mary Knox is here to discuss her progress with her obesity treatment plan. She is on the  follow the Category 2 plan and is following her eating plan approximately 30 % of the time. She states she is exercising 0 minutes 0 times per week. Mary Knox has struggled during the Thanksgiving holiday due to multiple celebrations. She did not follow the plan as well as she should. She has "sweet cravings" in the evening.  Her weight is 278 lb (126.1 kg) today and has had a weight gain of 2 pounds over a period of 2 weeks since her last visit. She has lost 0 lbs since starting treatment with [place].  Polycystic Ovarian Syndrome Mary Knox has a diagnosis of PCOS. She has been prescribed metformin, but has not taken it. Her last HgA1c was 5.0.   Hypothyroid Mary Knox has a diagnosis of hypothyroidism. She is on levothyroxine. She denies hot or cold intolerance or palpitations, but does admit to ongoing fatigue. Her last TSH was 1.80 on 03/27/18.   Insulin Resistance Mary Knox has a diagnosis of insulin resistance based on her elevated fasting insulin level of 47.8 and HgA1c of 5.6. Although Mary Knox's blood glucose readings are still under good control, insulin resistance puts her at greater risk of metabolic syndrome and diabetes. She is not taking metformin currently and continues to work on diet and exercise to decrease risk of diabetes. She admits to increased hunger and cravings in the evening.   At risk for diabetes Mary Knox is at higher than averagerisk for developing diabetes due to her obesity. She currently denies polyuria or polydipsia.  ALLERGIES: No Known Allergies  MEDICATIONS: Current Outpatient Medications on File Prior to Visit  Medication Sig Dispense Refill  . albuterol (PROVENTIL HFA;VENTOLIN HFA) 108 (90 Base) MCG/ACT inhaler Inhale 2 puffs into the lungs as needed for wheezing or shortness of breath. 1 Inhaler 1  .  ARIPiprazole (ABILIFY) 10 MG tablet Take 10 mg by mouth daily.    . Cholecalciferol (VITAMIN D3) 5000 units CAPS Take 1 capsule by mouth daily.    Marland Kitchen lamoTRIgine (LAMICTAL) 200 MG tablet Take 200 mg by mouth daily.    Marland Kitchen levothyroxine (SYNTHROID, LEVOTHROID) 50 MCG tablet Take by mouth.     No current facility-administered medications on file prior to visit.     PAST MEDICAL HISTORY: Past Medical History:  Diagnosis Date  . Anxiety   . Asthma   . Asthma   . B12 deficiency   . Back pain   . Bipolar 1 disorder ([place])   . Bipolar disorder ([place])   . Constipation   . Depression   . Depression   . Fatty liver   . Hashimoto's disease   . Lactose intolerance   . Overweight   . PCOS (polycystic ovarian syndrome)     PAST SURGICAL HISTORY: Past Surgical History:  Procedure Laterality Date  . WISDOM TOOTH EXTRACTION      SOCIAL HISTORY: Social History   Tobacco Use  . Smoking status: Never Smoker  . Smokeless tobacco: Never Used  Substance Use Topics  . Alcohol use: Yes    Comment:  rarely  . Drug use: No    FAMILY HISTORY: Family History  Problem Relation Age of Onset  . Alcohol abuse Father   . Bipolar disorder Father        also OCD  . Obesity Father   . Sleep apnea Father   .  Heart disease Father   . High blood pressure Father   . Diabetes Paternal Grandfather   . Stroke Paternal Grandfather   . Hypertension Unknown   . Parkinsonism Unknown   . Hypothyroidism Mother   . Obesity Mother     ROS: Review of Systems  Constitutional: Positive for malaise/fatigue. Negative for weight loss.  Endo/Heme/Allergies: Negative for polydipsia.       Negative for hot/cold intolerance  Negative for polyuria    PHYSICAL EXAM: Blood pressure 123/76, pulse 96, temperature (!) 97.4 F (36.3 C), temperature source Oral, height 5\' 7"  (1.702 m), weight 278 lb (126.1 kg), last menstrual period 03/27/2018, SpO2 95 %. Body mass index is 43.54 kg/m. Physical Exam    Constitutional: She is oriented to person, place, and time. She appears well-developed and well-nourished.  HENT:  Head: Normocephalic.  Eyes: Pupils are equal, round, and reactive to light.  Neck: Normal range of motion.  Cardiovascular: Normal rate.  Pulmonary/Chest: Effort normal.  Musculoskeletal: Normal range of motion.  Neurological: She is alert and oriented to person, place, and time.  Skin: Skin is warm and dry.  Psychiatric: She has a normal mood and affect. Her behavior is normal.  Vitals reviewed.   RECENT LABS AND TESTS: BMET    Component Value Date/Time   NA 138 02/16/2018 1605   K 3.9 02/16/2018 1605   CL 100 02/16/2018 1605   CO2 31 02/16/2018 1605   GLUCOSE 95 02/16/2018 1605   BUN 15 02/16/2018 1605   CREATININE 0.97 02/16/2018 1605   CALCIUM 10.0 02/16/2018 1605   GFRNONAA 77.98 03/23/2009 1454   Lab Results  Component Value Date   HGBA1C 5.6 03/24/2018   HGBA1C 5.6 11/12/2012   HGBA1C 5.4 03/23/2009   Lab Results  Component Value Date   INSULIN 47.8 (H) 03/24/2018   CBC    Component Value Date/Time   WBC 12.2 (H) 03/24/2018 1009   WBC 13.0 (H) 11/12/2012 1435   RBC 4.46 03/24/2018 1009   RBC 4.64 11/12/2012 1435   HGB 14.1 03/24/2018 1009   HCT 40.0 03/24/2018 1009   PLT 334.0 11/12/2012 1435   MCV 90 03/24/2018 1009   MCH 31.6 03/24/2018 1009   MCH 30.8 03/29/2011 1348   MCHC 35.3 03/24/2018 1009   MCHC 33.8 11/12/2012 1435   RDW 12.8 03/24/2018 1009   LYMPHSABS 3.8 (H) 03/24/2018 1009   MONOABS 0.8 11/12/2012 1435   EOSABS 0.6 (H) 03/24/2018 1009   BASOSABS 0.1 03/24/2018 1009   Iron/TIBC/Ferritin/ %Sat No results found for: IRON, TIBC, FERRITIN, IRONPCTSAT Lipid Panel     Component Value Date/Time   CHOL 212 (H) 03/23/2009 1454   TRIG 166.0 (H) 03/23/2009 1454   HDL 45.30 03/23/2009 1454   CHOLHDL 5 03/23/2009 1454   VLDL 33.2 03/23/2009 1454   LDLDIRECT 145.7 03/23/2009 1454   Hepatic Function Panel     Component Value  Date/Time   PROT 6.9 02/16/2018 1605   ALBUMIN 4.3 02/16/2018 1605   AST 14 02/16/2018 1605   ALT 28 02/16/2018 1605   ALKPHOS 119 (H) 02/16/2018 1605   BILITOT 0.3 02/16/2018 1605   BILIDIR 0.0 11/12/2012 1435      Component Value Date/Time   TSH 1.80 03/27/2018 0952   TSH 1.81 11/12/2012 1435   TSH 2.61 03/23/2009 1454    ASSESSMENT AND PLAN: PCOS (polycystic ovarian syndrome) - Plan: metFORMIN (GLUCOPHAGE) 500 MG tablet  Hypothyroidism, unspecified type  Insulin resistance  At risk for diabetes mellitus  Class 3 severe obesity with serious comorbidity and body mass index (BMI) of 40.0 to 44.9 in adult, unspecified obesity type ([place])  PLAN: Polycystic Ovarian Syndrome We discussed PCOS and the benefits and risks of metformin. She agrees to start to take metformin 500 mg daily #30 with no refills. Agrees to follow up with our clinic as directed.   Hypothyroid Mary Knox was informed of the importance of good thyroid control to help with weight loss efforts. She was also informed that supertheraputic thyroid levels are dangerous and will not improve weight loss results. She will continue medications as prescribed.   Insulin Resistance Mary Knox will continue to work on weight loss, exercise, and decreasing simple carbohydrates in her diet to help decrease the risk of diabetes. We dicussed metformin including benefits and risks. She was informed that eating too many simple carbohydrates or too many calories at one sitting increases the likelihood of GI side effects. She agrees to begin metformin. Insulin resistance discussed the registered dietician and myself.  Mary Knox agreed to follow up with [place] as directed to monitor her progress.  Diabetes risk counseling Mary Knox was given extended (15 minutes) diabetes prevention counseling today. She is 37 y.o. female and has risk factors for diabetes including obesity. We discussed intensive lifestyle modifications today with an emphasis on weight loss  as well as increasing exercise and decreasing simple carbohydrates in her diet.  Obesity Mary Knox is currently in the action stage of change. As such, her goal is to continue with weight loss efforts She has agreed to follow the Category 2 plan  We discussed decreasing carbohydrates, increasing protein intake, and increasing meal planning.  Mary Knox has been instructed to work up to a goal of 150 minutes of combined cardio and strengthening exercise per week for weight loss and overall health benefits. We discussed the following Behavioral Modification Strategies today: increasing lean protein intake, decreasing simple carbohydrates , increasing vegetables, increasing water intake, travel eating strategies, celebration eating strategies, and holiday eating strategies.    Mary Knox has agreed to follow up with our clinic in 2 weeks. She was informed of the importance of frequent follow up visits to maximize her success with intensive lifestyle modifications for her multiple health conditions.   OBESITY BEHAVIORAL INTERVENTION VISIT  Today's visit was # 2   Starting weight: 278 lb Starting date: 03/24/18 Today's weight : Weight: 278 lb (126.1 kg)  Today's date: 04/07/18 Total lbs lost to date: 0 At least 15 minutes were spent on discussing the following behavioral intervention visit.   ASK: We discussed the diagnosis of obesity with Mary Knox today and Mary Knox agreed to give [place] permission to discuss obesity behavioral modification therapy today.  ASSESS: Joelyn has the diagnosis of obesity and her BMI today is 43.53 Girlie is in the action stage of change   ADVISE: Mary Knox was educated on the multiple health risks of obesity as well as the benefit of weight loss to improve her health. She was advised of the need for long term treatment and the importance of lifestyle modifications to improve her current health and to decrease her risk of future health problems.  AGREE: Multiple dietary  modification options and treatment options were discussed and  Mary Knox agreed to follow the recommendations documented in the above note.  ARRANGE: Daleyssa was educated on the importance of frequent visits to treat obesity as outlined per CMS and USPSTF guidelines and agreed to schedule her next follow up appointment today.  I, Renee Ramus, am acting as  transcriptionist for Jearld Lesch, DO   I have reviewed the above documentation for accuracy and completeness, and I agree with the above. -Jearld Lesch, DO

## 2018-04-13 DIAGNOSIS — E8881 Metabolic syndrome: Secondary | ICD-10-CM | POA: Insufficient documentation

## 2018-04-13 DIAGNOSIS — Z9189 Other specified personal risk factors, not elsewhere classified: Secondary | ICD-10-CM | POA: Insufficient documentation

## 2018-04-17 ENCOUNTER — Encounter: Payer: Self-pay | Admitting: Internal Medicine

## 2018-04-17 ENCOUNTER — Ambulatory Visit: Payer: BC Managed Care – PPO | Admitting: Internal Medicine

## 2018-04-17 VITALS — BP 128/78 | HR 96 | Temp 98.6°F | Resp 16 | Ht 67.0 in | Wt 281.2 lb

## 2018-04-17 DIAGNOSIS — B349 Viral infection, unspecified: Secondary | ICD-10-CM | POA: Diagnosis not present

## 2018-04-17 LAB — POCT INFLUENZA A/B
Influenza A, POC: NEGATIVE
Influenza B, POC: NEGATIVE

## 2018-04-17 LAB — POCT RAPID STREP A (OFFICE): RAPID STREP A SCREEN: NEGATIVE

## 2018-04-17 MED ORDER — OSELTAMIVIR PHOSPHATE 75 MG PO CAPS: 75.0000 mg | ORAL_CAPSULE | Freq: Two times a day (BID) | ORAL | 0 refills | Status: DC

## 2018-04-17 MED FILL — OSELTAMIVIR PHOSPHATE 75 MG: 75 | 5 days supply | Qty: 10 | Fill #0

## 2018-04-17 NOTE — Progress Notes (Signed)
Pre visit review using our clinic review tool, if applicable. No additional management support is needed unless otherwise documented below in the visit note. 

## 2018-04-17 NOTE — Progress Notes (Signed)
Subjective:    Patient ID: Mary Knox, female    DOB: 1980-10-17, 37 y.o.   MRN: [id number]  DOS:  04/17/2018 Type of visit - description : Acute visit She woke up yesterday feeling well, in a matter of few hours she developed ear pain, fatigue and fever.  Temperature has been between 99.5 and 100.5. She also developed cough with greenish sputum production. Today she continue with malaise and some cough. 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  Review of Systems Denies nausea, vomiting, diarrhea. Admits to sinus congestion but no sore throat No headache or a rash  Past Medical History:  Diagnosis Date  . Anxiety   . Asthma   . Asthma   . B12 deficiency   . Back pain   . Bipolar 1 disorder ([place])   . Bipolar disorder ([place])   . Constipation   . Depression   . Depression   . Fatty liver   . Hashimoto's disease   . Lactose intolerance   . Overweight   . PCOS (polycystic ovarian syndrome)     Past Surgical History:  Procedure Laterality Date  . WISDOM TOOTH EXTRACTION      Social History   Socioeconomic History  . Marital status: Single    Spouse name: Not on file  . Number of children: Not on file  . Years of education: Not on file  . Highest education level: Not on file  Occupational History  . Occupation: Higher education careers adviser  Social Needs  . Financial resource strain: Not on file  . Food insecurity:    Worry: Not on file    Inability: Not on file  . Transportation needs:    Medical: Not on file    Non-medical: Not on file  Tobacco Use  . Smoking status: Never Smoker  . Smokeless tobacco: Never Used  Substance and Sexual Activity  . Alcohol use: Yes    Comment:  rarely  . Drug use: No  . Sexual activity: Not on file  Lifestyle  . Physical activity:    Days per week: Not on file    Minutes per session: Not on file  . Stress: Not on file  Relationships  . Social connections:    Talks on phone: Not on file    Gets together:  Not on file    Attends religious service: Not on file    Active member of club or organization: Not on file    Attends meetings of clubs or organizations: Not on file    Relationship status: Not on file  . Intimate partner violence:    Fear of current or ex partner: Not on file    Emotionally abused: Not on file    Physically abused: Not on file    Forced sexual activity: Not on file  Other Topics Concern  . Not on file  Social History Narrative  . Not on file      Allergies as of 04/17/2018   No Known Allergies     Medication List       Accurate as of April 17, 2018  5:46 PM. Always use your most recent med list.        albuterol 108 (90 Base) MCG/ACT inhaler Commonly known as:  PROVENTIL HFA;VENTOLIN HFA Inhale 2 puffs into the lungs as needed for wheezing or shortness of breath.   ARIPiprazole 10 MG tablet Commonly known as:  ABILIFY Take 10 mg by mouth  daily.   lamoTRIgine 200 MG tablet Commonly known as:  LAMICTAL Take 200 mg by mouth daily.   levothyroxine 50 MCG tablet Commonly known as:  SYNTHROID, LEVOTHROID Take by mouth.   metFORMIN 500 MG tablet Commonly known as:  GLUCOPHAGE Take 1 tablet (500 mg total) by mouth daily with breakfast.   oseltamivir 75 MG capsule Commonly known as:  TAMIFLU Take 1 capsule (75 mg total) by mouth 2 (two) times daily.   Vitamin D3 125 MCG (5000 UT) Caps Take 1 capsule by mouth daily.           Objective:   Physical Exam BP 128/78 (BP Location: Left Arm, Patient Position: Sitting, Cuff Size: Normal)   Pulse 96   Temp 98.6 F (37 C) (Oral)   Resp 16   Ht 5\' 7"  (1.702 m)   Wt 281 lb 4 oz (127.6 kg)   LMP 03/27/2018   SpO2 96%   BMI 44.05 kg/m  General:   Well developed, NAD, BMI noted. HEENT:  Normocephalic . Face symmetric, atraumatic.  TMs normal Throat quite red, no white patches.  Symmetric. Sinuses no TTP.  Nose quite congested. Lungs:  Few rhonchi, + large airway congestion.  No  distress Normal respiratory effort, no intercostal retractions, no accessory muscle use. Heart: RRR,  no murmur.  No pretibial edema bilaterally  Skin: Not pale. Not jaundice Neurologic:  alert & oriented X3.  Speech normal, gait appropriate for age and unassisted Psych--  Cognition and judgment appear intact.  Cooperative with normal attention span and concentration.  Behavior appropriate. No anxious or depressed appearing.      Assessment & Plan:    37 year old female, PMH includes bipolar, exercise induced asthma, thyroid disease, overweight, not on BCP (not sexualy active) , presents with  Viral syndrome: Patient presents with acute onset of respiratory symptoms and fever, nontoxic-appearing.  Her influenza test is negative however we had 2 documented cases at the office yesterday and today. Strep test negative Plan: Treat with Tamiflu for presumed influenza, supportive treatment.  See instructions.  Work note if needed.

## 2018-04-17 NOTE — Patient Instructions (Addendum)
Rest, fluids , tylenol  For cough:  Take Mucinex DM twice a day as needed until better  For nasal congestion: Use OTC Nasocort or Flonase : 2 nasal sprays on each side of the nose in the morning until you feel better   Get pseudoephedrine 30 mg (behind the counter, you need to talk with the pharmacist) take one tablet 3 or 4 times a day as needed for congestion  Take Tamiflu  Call if not gradually better over the next 5 day  Also, call if you get worse or if you initially improve and then the symptoms come back.

## 2018-04-20 ENCOUNTER — Encounter: Payer: Self-pay | Admitting: Internal Medicine

## 2018-04-21 ENCOUNTER — Telehealth: Payer: Self-pay | Admitting: Family Medicine

## 2018-04-21 ENCOUNTER — Encounter: Payer: Self-pay | Admitting: Internal Medicine

## 2018-04-21 MED ORDER — HYDROCODONE-HOMATROPINE 5-1.5 MG/5ML PO SYRP: 5.0000 mL | ORAL_SOLUTION | Freq: Two times a day (BID) | ORAL | 0 refills | Status: DC | PRN

## 2018-04-21 NOTE — Telephone Encounter (Signed)
Please advise 

## 2018-04-21 NOTE — Telephone Encounter (Signed)
Spoke w/ Pt- informed of recommendations. Pt verbalized understanding.  

## 2018-04-21 NOTE — Telephone Encounter (Signed)
Copied from [place] [id number]. Topic: General - Other >> Apr 21, 2018  2:45 PM Keene Breath wrote: Reason for CRM: Patient called to request something for her cough.  She stated that she saw the doctor and he said she had the flu and gave her some medication, but she needs some to control the cough.  Please advise and call back at [phone]

## 2018-04-21 NOTE — Telephone Encounter (Signed)
Advise pt: I just sent a prescription for hydrocodone, a stronger cough suppressant.  Will cause drowsiness, do not drive if that is the case.

## 2018-04-23 ENCOUNTER — Ambulatory Visit (INDEPENDENT_AMBULATORY_CARE_PROVIDER_SITE_OTHER): Payer: Self-pay | Admitting: Bariatrics

## 2018-04-23 ENCOUNTER — Encounter (INDEPENDENT_AMBULATORY_CARE_PROVIDER_SITE_OTHER): Payer: Self-pay

## 2018-04-24 ENCOUNTER — Encounter: Payer: Self-pay | Admitting: Internal Medicine

## 2018-04-24 ENCOUNTER — Other Ambulatory Visit: Payer: Self-pay | Admitting: Internal Medicine

## 2018-04-24 MED ORDER — AMOXICILLIN 875 MG PO TABS: 875.0000 mg | ORAL_TABLET | Freq: Two times a day (BID) | ORAL | 0 refills | Status: DC

## 2018-04-30 ENCOUNTER — Encounter: Payer: Self-pay | Admitting: Family Medicine

## 2018-04-30 ENCOUNTER — Ambulatory Visit: Payer: BC Managed Care – PPO | Admitting: Family Medicine

## 2018-04-30 VITALS — BP 132/82 | HR 72 | Temp 98.4°F | Resp 16 | Ht 67.0 in | Wt 283.0 lb

## 2018-04-30 DIAGNOSIS — R05 Cough: Secondary | ICD-10-CM

## 2018-04-30 DIAGNOSIS — R058 Other specified cough: Secondary | ICD-10-CM

## 2018-04-30 MED ORDER — BENZONATATE 100 MG PO CAPS: 100.0000 mg | ORAL_CAPSULE | Freq: Three times a day (TID) | ORAL | 0 refills | Status: DC | PRN

## 2018-04-30 NOTE — Progress Notes (Signed)
Chief Complaint  Patient presents with  . URI    cough, congestion, drainage, flu 2 weeks ago-taken amoxicillin    Mary Knox here for URI complaints.  Duration: 2 weeks  Associated symptoms: sinus congestion, rhinorrhea, shortness of breath and cough Denies: sinus pain, itchy watery eyes, ear pain, ear drainage, sore throat, wheezing, myalgia and fevers Treatment to date: amoxicillin, tamiflu Sick contacts: No  ROS:  Const: Denies fevers HEENT: As noted in HPI Lungs: +cough  Past Medical History:  Diagnosis Date  . Anxiety   . Asthma   . Asthma   . B12 deficiency   . Back pain   . Bipolar 1 disorder ([place])   . Bipolar disorder ([place])   . Constipation   . Depression   . Depression   . Fatty liver   . Hashimoto's disease   . Lactose intolerance   . Overweight   . PCOS (polycystic ovarian syndrome)     BP 132/82 (BP Location: Left Arm, Patient Position: Sitting, Cuff Size: Large)   Pulse 72   Temp 98.4 F (36.9 C) (Oral)   Resp 16   Ht 5\' 7"  (1.702 m)   Wt 283 lb (128.4 kg)   SpO2 96%   BMI 44.32 kg/m  General: Awake, alert, appears stated age HEENT: AT, [place], ears patent b/l and TM's neg, nares patent w/o discharge, pharynx pink and without exudates, MMM Neck: No masses or asymmetry Heart: RRR Lungs: CTAB, no accessory muscle use Psych: Age appropriate judgment and insight, normal mood and affect  Post-viral cough syndrome - Plan: benzonatate (TESSALON) 100 MG capsule  Orders as above. Told pt this could last up to 4-6 weeks after resolution of illness.  Continue to push fluids, practice good hand hygiene, cover mouth when coughing. F/u prn. If starting to experience fevers, shaking, or shortness of breath, seek immediate care. Pt voiced understanding and agreement to the plan.  Section, DO 04/30/18 2:30 PM

## 2018-04-30 NOTE — Patient Instructions (Addendum)
Continue to push fluids, practice good hand hygiene, and cover your mouth if you cough.  If you start having fevers, shaking or shortness of breath, seek immediate care.  This can last 4-6 weeks after the initial illness has gone away.  Let us know if you need anything.

## 2018-05-11 ENCOUNTER — Ambulatory Visit (INDEPENDENT_AMBULATORY_CARE_PROVIDER_SITE_OTHER): Payer: Self-pay | Admitting: Bariatrics

## 2018-05-11 ENCOUNTER — Encounter (INDEPENDENT_AMBULATORY_CARE_PROVIDER_SITE_OTHER): Payer: Self-pay

## 2018-05-18 ENCOUNTER — Other Ambulatory Visit: Payer: Self-pay | Admitting: Family Medicine

## 2018-05-18 ENCOUNTER — Encounter: Payer: Self-pay | Admitting: Emergency Medicine

## 2018-06-04 ENCOUNTER — Other Ambulatory Visit: Payer: Self-pay | Admitting: Psychiatry

## 2018-06-04 NOTE — Telephone Encounter (Signed)
Need to review paper chart  

## 2018-06-06 NOTE — Progress Notes (Deleted)
[facility] Clinic Note - Initial Visit   Date: 06/06/18  Mary Knox MRN: [id number] DOB: Oct 31, 1980   Dear Dr. Etter Sjogren:  Thank you for your kind referral of Mary Knox for consultation of bilateral hand numbness. Although her history is well known to you, please allow [place] to reiterate it for the purpose of our medical record. The patient was accompanied to the clinic by *** who also provides collateral information.     History of Present Illness: Mary Knox is a 38 y.o. ***-handed Caucasian female with anxiety, asthma, bipolar depression, Hashimoto's thyroiditis, PCOS, and *** presenting for evaluation of bilateral hand numbness.    Out-side paper records, electronic medical record, and images have been reviewed where available and summarized as: *** Lab Results  Component Value Date   TSH 1.80 03/27/2018   Lab Results  Component Value Date   [id number] 759 03/27/2018   Lab Results  Component Value Date   FOLATE >20.0 ng/mL 03/23/2009   Lab Results  Component Value Date   HGBA1C 5.6 03/24/2018     Past Medical History:  Diagnosis Date  . Anxiety   . Asthma   . Asthma   . B12 deficiency   . Back pain   . Bipolar 1 disorder ([place])   . Bipolar disorder ([place])   . Constipation   . Depression   . Depression   . Fatty liver   . Hashimoto's disease   . Lactose intolerance   . Overweight   . PCOS (polycystic ovarian syndrome)     Past Surgical History:  Procedure Laterality Date  . WISDOM TOOTH EXTRACTION       Medications:  Outpatient Encounter Medications as of 06/10/2018  Medication Sig  . albuterol (PROVENTIL HFA;VENTOLIN HFA) 108 (90 Base) MCG/ACT inhaler Inhale 2 puffs into the lungs as needed for wheezing or shortness of breath.  . ARIPiprazole (ABILIFY) 10 MG tablet Take 10 mg by mouth daily.  . ARIPiprazole (ABILIFY) 15 MG tablet TAKE ONE-HALF TABLET BY MOUTH DAILY  . benzonatate (TESSALON) 100 MG capsule Take 1  capsule (100 mg total) by mouth 3 (three) times daily as needed.  . Cholecalciferol (VITAMIN D3) 5000 units CAPS Take 1 capsule by mouth daily.  Marland Kitchen lamoTRIgine (LAMICTAL) 200 MG tablet TAKE ONE TABLET BY MOUTH DAILY  . levothyroxine (SYNTHROID, LEVOTHROID) 50 MCG tablet Take by mouth.  . metFORMIN (GLUCOPHAGE) 500 MG tablet Take 1 tablet (500 mg total) by mouth daily with breakfast.   No facility-administered encounter medications on file as of 06/10/2018.      Allergies: No Known Allergies  Family History: Family History  Problem Relation Age of Onset  . Alcohol abuse Father   . Bipolar disorder Father        also OCD  . Obesity Father   . Sleep apnea Father   . Heart disease Father   . High blood pressure Father   . Diabetes Paternal Grandfather   . Stroke Paternal Grandfather   . Hypertension Other   . Parkinsonism Other   . Hypothyroidism Mother   . Obesity Mother     Social History: Social History   Tobacco Use  . Smoking status: Never Smoker  . Smokeless tobacco: Never Used  Substance Use Topics  . Alcohol use: Yes    Comment:  rarely  . Drug use: No   Social History   Social History Narrative  . Not on file    Review of Systems:  CONSTITUTIONAL:  No fevers, chills, night sweats, or weight loss.  *** EYES: No visual changes or eye pain ENT: No hearing changes.  No history of nose bleeds.   RESPIRATORY: No cough, wheezing and shortness of breath.   CARDIOVASCULAR: Negative for chest pain, and palpitations.   GI: Negative for abdominal discomfort, blood in stools or black stools.  No recent change in bowel habits.   GU:  No history of incontinence.   MUSCLOSKELETAL: No history of joint pain or swelling.  No myalgias.   SKIN: Negative for lesions, rash, and itching.   HEMATOLOGY/ONCOLOGY: Negative for prolonged bleeding, bruising easily, and swollen nodes.  No history of cancer.   ENDOCRINE: Negative for cold or heat intolerance, polydipsia or goiter.     PSYCH:  ***depression or anxiety symptoms.   NEURO: As Above.   Vital Signs:  There were no vitals taken for this visit. Pain Scale: *** on a scale of 0-10   General Medical Exam:  *** General:  Well appearing, comfortable.   Eyes/ENT: see cranial nerve examination.   Neck: No masses appreciated.  Full range of motion without tenderness.  No carotid bruits. Respiratory:  Clear to auscultation, good air entry bilaterally.   Cardiac:  Regular rate and rhythm, no murmur.   Extremities:  No deformities, edema, or skin discoloration.  Skin:  No rashes or lesions.  Neurological Exam: MENTAL STATUS including orientation to time, place, person, recent and remote memory, attention span and concentration, language, and fund of knowledge is ***normal.  Speech is not dysarthric.  CRANIAL NERVES: II:  No visual field defects.  Unremarkable fundi.   III-IV-VI: Pupils equal round and reactive to light.  Normal conjugate, extra-ocular eye movements in all directions of gaze.  No nystagmus.  No ptosis***.   V:  Normal facial sensation.  Jaw jerk is ***.   VII:  Normal facial symmetry and movements.  No pathologic facial reflexes.  VIII:  Normal hearing and vestibular function.   IX-X:  Normal palatal movement.   XI:  Normal shoulder shrug and head rotation.   XII:  Normal tongue strength and range of motion, no deviation or fasciculation.  MOTOR:  No atrophy, fasciculations or abnormal movements.  No pronator drift.  Tone is normal.    Right Upper Extremity:    Left Upper Extremity:    Deltoid  5/5   Deltoid  5/5   Biceps  5/5   Biceps  5/5   Triceps  5/5   Triceps  5/5   Wrist extensors  5/5   Wrist extensors  5/5   Wrist flexors  5/5   Wrist flexors  5/5   Finger extensors  5/5   Finger extensors  5/5   Finger flexors  5/5   Finger flexors  5/5   Dorsal interossei  5/5   Dorsal interossei  5/5   Abductor pollicis  5/5   Abductor pollicis  5/5   Tone (Ashworth scale)  0  Tone (Ashworth  scale)  0   Right Lower Extremity:    Left Lower Extremity:    Hip flexors  5/5   Hip flexors  5/5   Hip extensors  5/5   Hip extensors  5/5   Knee flexors  5/5   Knee flexors  5/5   Knee extensors  5/5   Knee extensors  5/5   Dorsiflexors  5/5   Dorsiflexors  5/5   Plantarflexors  5/5   Plantarflexors  5/5   Toe extensors  5/5   Toe extensors  5/5   Toe flexors  5/5   Toe flexors  5/5   Tone (Ashworth scale)  0  Tone (Ashworth scale)  0   MSRs:  Right                                                                 Left brachioradialis 2+  brachioradialis 2+  biceps 2+  biceps 2+  triceps 2+  triceps 2+  patellar 2+  patellar 2+  ankle jerk 2+  ankle jerk 2+  Hoffman no  Hoffman no  plantar response down  plantar response down   SENSORY:  Normal and symmetric perception of light touch, pinprick, vibration, and proprioception.  Romberg's sign absent.   COORDINATION/GAIT: Normal finger-to- nose-finger and heel-to-shin.  Intact rapid alternating movements bilaterally.  Able to rise from a chair without using arms.  Gait narrow based and stable. Tandem and stressed gait intact.    IMPRESSION: ***  PLAN/RECOMMENDATIONS:  *** Return to clinic in *** months.   The duration of this appointment visit was *** minutes of face-to-face time with the patient.  Greater than 50% of this time was spent in counseling, explanation of diagnosis, planning of further management, and coordination of care.   Thank you for allowing me to participate in patient's care.  If I can answer any additional questions, I would be pleased to do so.    Sincerely,    Donika K. Posey Pronto, DO

## 2018-06-09 ENCOUNTER — Other Ambulatory Visit: Payer: Self-pay | Admitting: *Deleted

## 2018-06-10 ENCOUNTER — Encounter

## 2018-06-10 ENCOUNTER — Ambulatory Visit: Payer: BC Managed Care – PPO | Admitting: Neurology

## 2018-06-25 ENCOUNTER — Ambulatory Visit: Payer: Self-pay | Admitting: Psychiatry

## 2018-07-13 ENCOUNTER — Telehealth: Payer: Self-pay | Admitting: Psychiatry

## 2018-07-13 ENCOUNTER — Ambulatory Visit: Payer: BC Managed Care – PPO | Admitting: Family Medicine

## 2018-07-13 ENCOUNTER — Encounter: Payer: Self-pay | Admitting: Family Medicine

## 2018-07-13 ENCOUNTER — Other Ambulatory Visit: Payer: Self-pay

## 2018-07-13 VITALS — BP 126/82 | HR 83 | Resp 16 | Ht 67.0 in | Wt 289.0 lb

## 2018-07-13 DIAGNOSIS — J4520 Mild intermittent asthma, uncomplicated: Secondary | ICD-10-CM

## 2018-07-13 MED ORDER — BECLOMETHASONE DIPROP HFA 40 MCG/ACT IN AERB: 2.0000 | INHALATION_SPRAY | Freq: Two times a day (BID) | RESPIRATORY_TRACT | 2 refills | Status: DC

## 2018-07-13 MED ORDER — LAMOTRIGINE 200 MG PO TABS: 200.0000 mg | ORAL_TABLET | Freq: Every day | ORAL | 0 refills | Status: DC

## 2018-07-13 MED ORDER — ARIPIPRAZOLE 15 MG PO TABS: 7.5000 mg | ORAL_TABLET | Freq: Every day | ORAL | 0 refills | Status: DC

## 2018-07-13 NOTE — Telephone Encounter (Signed)
Need to review paper chart not seen in epic  

## 2018-07-13 NOTE — Telephone Encounter (Signed)
30 day supply of both medications submitted, in case dose change at next office visit 04/06. Last office visit 10/2017 Follow up 9 months later, scheduled 08/10/2018

## 2018-07-13 NOTE — Patient Instructions (Signed)
Beclomethasone respiratory inhalation aerosol What is this medicine? BECLOMETHASONE (be kloe METH a sone) is a corticosteroid. It helps decrease inflammation in your lungs. This medicine is used to treat the symptoms of asthma. Never use this medicine for an acute asthma attack. This medicine may be used for other purposes; ask your health care provider or pharmacist if you have questions. COMMON BRAND NAME(S): Beclovent, QVAR, Vancenase, Vancenase AQ, Vanceril What should I tell my health care provider before I take this medicine? They need to know if you have any of these conditions: -bone problems -eye disease, vision problems -glaucoma -immune system problems -infection, like chickenpox, tuberculosis, herpes, or fungal infection -recent surgery or injury of mouth or throat -taking corticosteroids by mouth -an unusual or allergic reaction to beclomethasone, other corticosteroids, other medicines, foods, dyes, or preservatives -pregnant or trying to get pregnant -breast-feeding How should I use this medicine? This medicine is for inhalation through the mouth. Rinse your mouth with water after use. Make sure not to swallow the water. Follow the directions on your prescription label. This medicine works best if used regularly. Do not use more than directed. Do not stop taking except on your doctor's advice. Make sure that you are using your inhaler correctly. Ask you doctor or health care provider if you have any questions. Talk to your pediatrician regarding the use of this medicine in children. While this drug may be prescribed for children as young as 26 years old for selected conditions, precautions do apply. Overdosage: If you think you have taken too much of this medicine contact a poison control center or emergency room at once. NOTE: This medicine is only for you. Do not share this medicine with others. What if I miss a dose? If you miss a dose, use it as soon as you remember. If it is  almost time for your next dose, use only that dose and continue with your regular schedule, spacing doses evenly. Do not use double or extra doses. What may interact with this medicine? Interactions are not expected. This list may not describe all possible interactions. Give your health care provider a list of all the medicines, herbs, non-prescription drugs, or dietary supplements you use. Also tell them if you smoke, drink alcohol, or use illegal drugs. Some items may interact with your medicine. What should I watch for while using this medicine? Visit your doctor or health care professional for regular check ups. Use this medicine regularly. Tell your doctor if your symptoms do not improve. Do not use extra medicine. If your asthma symptoms get worse while you are using this medicine, call your doctor right away. Do not come in contact with people who have chickenpox or the measles while you are taking this medicine. If you do, call your doctor right away. What side effects may I notice from receiving this medicine? Side effects that you should report to your doctor or health care professional as soon as possible: -allergic reactions like skin rash, itching or hives, swelling of the face, lips, or tongue -changes in vision -chest pain, tightness -fever, infection -trouble breathing, wheezing -unusual swelling -white patches or sores in the mouth or throat Side effects that usually do not require medical attention (report to your doctor or health care professional if they continue or are bothersome): -burning, irritation in throat -cough -dry mouth -headache -unusual taste or smell This list may not describe all possible side effects. Call your doctor for medical advice about side effects. You may report side  effects to FDA at 1-800-FDA-1088. Where should I keep my medicine? Keep out of the reach of children. Store at room temperature between 15 and 30 degrees C (59 and 86 degrees F). Do  not puncture the canister or throw on a fire or incinerator. Throw away any unused medicine after the expiration date. NOTE: This sheet is a summary. It may not cover all possible information. If you have questions about this medicine, talk to your doctor, pharmacist, or health care provider.  2019 Elsevier/Gold Standard (2017-09-10 14:44:44)

## 2018-07-13 NOTE — Progress Notes (Signed)
Patient ID: Mary Knox, female    DOB: June 27, 1980  Age: 38 y.o. MRN: [id number]    Subjective:  Subjective  HPI Mary Knox presents for f/u asthma -- she has noticed that she is still having problems  Esp going up stairs or walking     She is using her albuterol regularly   Review of Systems  Constitutional: Negative for appetite change, diaphoresis, fatigue and unexpected weight change.  Eyes: Negative for pain, redness and visual disturbance.  Respiratory: Positive for cough and wheezing. Negative for chest tightness and shortness of breath.   Cardiovascular: Negative for chest pain, palpitations and leg swelling.  Endocrine: Negative for cold intolerance, heat intolerance, polydipsia, polyphagia and polyuria.  Genitourinary: Negative for difficulty urinating, dysuria and frequency.  Neurological: Negative for dizziness, light-headedness, numbness and headaches.    History Past Medical History:  Diagnosis Date  . Anxiety   . Asthma   . Asthma   . B12 deficiency   . Back pain   . Bipolar 1 disorder ([place])   . Bipolar disorder ([place])   . Constipation   . Depression   . Depression   . Fatty liver   . Hashimoto's disease   . Lactose intolerance   . Overweight   . PCOS (polycystic ovarian syndrome)     She has a past surgical history that includes Wisdom tooth extraction.   Her family history includes Alcohol abuse in her father; Bipolar disorder in her father; Diabetes in her paternal grandfather; Heart disease in her father; High blood pressure in her father; Hypertension in an other family member; Hypothyroidism in her mother; Obesity in her father and mother; Parkinsonism in an other family member; Sleep apnea in her father; Stroke in her paternal grandfather.She reports that she has never smoked. She has never used smokeless tobacco. She reports current alcohol use. She reports that she does not use drugs.  Current Outpatient Medications on File Prior to Visit    Medication Sig Dispense Refill  . albuterol (PROVENTIL HFA;VENTOLIN HFA) 108 (90 Base) MCG/ACT inhaler Inhale 2 puffs into the lungs as needed for wheezing or shortness of breath. 1 Inhaler 1  . benzonatate (TESSALON) 100 MG capsule Take 1 capsule (100 mg total) by mouth 3 (three) times daily as needed. 30 capsule 0  . Cholecalciferol (VITAMIN D3) 5000 units CAPS Take 1 capsule by mouth daily.    Marland Kitchen levothyroxine (SYNTHROID, LEVOTHROID) 50 MCG tablet Take by mouth.    . metFORMIN (GLUCOPHAGE) 500 MG tablet Take 1 tablet (500 mg total) by mouth daily with breakfast. 30 tablet 0   No current facility-administered medications on file prior to visit.      Objective:  Objective  Physical Exam Vitals signs and nursing note reviewed.  Constitutional:      Appearance: She is well-developed.  HENT:     Head: Normocephalic and atraumatic.  Eyes:     Conjunctiva/sclera: Conjunctivae normal.  Neck:     Musculoskeletal: Normal range of motion and neck supple.     Thyroid: No thyromegaly.     Vascular: No carotid bruit or JVD.  Cardiovascular:     Rate and Rhythm: Normal rate and regular rhythm.     Heart sounds: Normal heart sounds. No murmur.  Pulmonary:     Effort: Pulmonary effort is normal. No respiratory distress.     Breath sounds: No decreased breath sounds, wheezing or rales.  Chest:     Chest wall: No tenderness.  Neurological:  Mental Status: She is alert and oriented to person, place, and time.    BP 126/82   Pulse 83   Resp 16   Ht 5\' 7"  (1.702 m)   Wt 289 lb (131.1 kg)   SpO2 97%   BMI 45.26 kg/m  Wt Readings from Last 3 Encounters:  07/13/18 289 lb (131.1 kg)  04/30/18 283 lb (128.4 kg)  04/17/18 281 lb 4 oz (127.6 kg)     Lab Results  Component Value Date   WBC 12.2 (H) 03/24/2018   HGB 14.1 03/24/2018   HCT 40.0 03/24/2018   PLT 334.0 11/12/2012   GLUCOSE 95 02/16/2018   CHOL 212 (H) 03/23/2009   TRIG 166.0 (H) 03/23/2009   HDL 45.30 03/23/2009    LDLDIRECT 145.7 03/23/2009   ALT 28 02/16/2018   AST 14 02/16/2018   NA 138 02/16/2018   K 3.9 02/16/2018   CL 100 02/16/2018   CREATININE 0.97 02/16/2018   BUN 15 02/16/2018   CO2 31 02/16/2018   TSH 1.80 03/27/2018   HGBA1C 5.6 03/24/2018    Dg Chest 2 View  Result Date: 05/16/2015 CLINICAL DATA:  Cough for 5 days.  On antibiotics. EXAM: CHEST  2 VIEW COMPARISON:  None FINDINGS: The heart size and mediastinal contours are within normal limits. Scar like density is noted in the left midlung. The visualized skeletal structures are unremarkable. IMPRESSION: No active cardiopulmonary disease. Electronically Signed   By: Kerby Moors M.D.   On: 05/16/2015 14:42     Assessment & Plan:  Plan  I am having Mary Knox start on beclomethasone. I am also having her maintain her levothyroxine, Vitamin D3, albuterol, metFORMIN, and benzonatate.  Meds ordered this encounter  Medications  . beclomethasone (QVAR REDIHALER) 40 MCG/ACT inhaler    Sig: Inhale 2 puffs into the lungs 2 (two) times daily.    Dispense:  1 Inhaler    Refill:  2    Problem List Items Addressed This Visit      Unprioritized   Asthma - Primary   Relevant Medications   beclomethasone (QVAR REDIHALER) 40 MCG/ACT inhaler   Other Relevant Orders   Spirometry with Graph (Completed)         con't to use albuterol prn and rto  2 weeks or sooner prn   Follow-up: Return in about 2 weeks (around 07/27/2018).  Ann Held, DO

## 2018-07-13 NOTE — Telephone Encounter (Signed)
Patient would like refills on Abilify and Lamictal for 90 day supply or until her appointment on 04/06 to be sent to [facility] on [address], she will be out starting tomorrow

## 2018-07-14 ENCOUNTER — Telehealth: Payer: Self-pay | Admitting: Psychiatry

## 2018-07-14 ENCOUNTER — Ambulatory Visit: Payer: Self-pay | Admitting: Psychiatry

## 2018-07-14 NOTE — Telephone Encounter (Signed)
Rayvon called to request another 30 day supply of all her medications because people are being told to obtain extra medications as preparation.  Next appt 4/6.  Send to [facility] at [facility]

## 2018-07-15 NOTE — Telephone Encounter (Signed)
Pt hasn't been in for office visit recently

## 2018-07-20 ENCOUNTER — Telehealth: Payer: Self-pay

## 2018-07-20 DIAGNOSIS — J4520 Mild intermittent asthma, uncomplicated: Secondary | ICD-10-CM

## 2018-07-20 MED ORDER — BECLOMETHASONE DIPROP HFA 40 MCG/ACT IN AERB: 2.0000 | INHALATION_SPRAY | Freq: Two times a day (BID) | RESPIRATORY_TRACT | 5 refills | Status: DC

## 2018-07-20 NOTE — Telephone Encounter (Signed)
Rx sent w/ note stating okay to refill early.

## 2018-07-20 NOTE — Telephone Encounter (Signed)
Copied from [place] [id number]. Topic: General - Other >> Jul 20, 2018 12:51 PM Wynetta Emery, [place] C wrote: Reason for CRM: pt called in to be advised. Pt would like to know if provider would okay her to have a early refill on beclomethasone (QVAR REDIHALER) 40 MCG/ACT inhaler? Pt says that she tried to request refill via pharmacy but they will not fill unless PCP approves.   Please advise.

## 2018-07-22 NOTE — Telephone Encounter (Signed)
Steroids  An inc infection risk but if she cant breathe there are worse problems---  All steroid inhalers are the some

## 2018-07-22 NOTE — Telephone Encounter (Signed)
Patient stated that she has been taking the QVAR for 5 days now and she now does not want to take the medicine because of side effects (causing respiratory infections).  Advised that all medicines have side effects.  She is willing to change to something else.

## 2018-07-22 NOTE — Telephone Encounter (Signed)
Patient says her insurance won't cover the early fill until April 1st. Now she wants to stop taking the script and wants to know if she can just stop it or if she needs to be weaned off.

## 2018-07-23 MED ORDER — NYSTATIN 100000 UNIT/ML MT SUSP: 5.0000 mL | Freq: Four times a day (QID) | OROMUCOSAL | 0 refills | Status: DC

## 2018-07-23 NOTE — Telephone Encounter (Signed)
LMOM informing Pt to return call. Okay for PEC to discuss. Rx sent to [facility].

## 2018-07-23 NOTE — Telephone Encounter (Signed)
Patient calling back to inquire if one of the possible side effects for this medication could include sore throat?

## 2018-07-23 NOTE — Telephone Encounter (Signed)
The steroid inhaler can cause thrush which can cause a sore throat--- which is why we tell people to brush tongue and rinse mouth with water after using it

## 2018-07-23 NOTE — Telephone Encounter (Signed)
Nystatin swish and spit 5 ml qid  #120 cc

## 2018-07-23 NOTE — Telephone Encounter (Signed)
Spoke w/ Pt- informed of recommendations. She has been rinsing her mouth and brushing tongue frequently, no symptoms of thrush other than a sore throat. She will d/c Qvar for several days to see if she improves- if not she will let us know.

## 2018-07-23 NOTE — Telephone Encounter (Signed)
Patient called back and said her tongue is white and she thinks she does have thrush

## 2018-07-23 NOTE — Telephone Encounter (Signed)
Please advise 

## 2018-07-23 NOTE — Addendum Note (Signed)
Addended byDamita Dunnings D on: 07/23/2018 03:49 PM   Modules accepted: Orders

## 2018-07-28 ENCOUNTER — Ambulatory Visit: Payer: BC Managed Care – PPO | Admitting: Family Medicine

## 2018-07-29 ENCOUNTER — Encounter: Payer: Self-pay | Admitting: Family Medicine

## 2018-07-30 ENCOUNTER — Ambulatory Visit (INDEPENDENT_AMBULATORY_CARE_PROVIDER_SITE_OTHER): Payer: BC Managed Care – PPO | Admitting: Family Medicine

## 2018-07-30 ENCOUNTER — Other Ambulatory Visit: Payer: Self-pay

## 2018-07-30 DIAGNOSIS — J029 Acute pharyngitis, unspecified: Secondary | ICD-10-CM | POA: Diagnosis not present

## 2018-07-30 DIAGNOSIS — B370 Candidal stomatitis: Secondary | ICD-10-CM | POA: Diagnosis not present

## 2018-07-30 MED ORDER — NYSTATIN 100000 UNIT/ML MT SUSP: 5.0000 mL | Freq: Four times a day (QID) | OROMUCOSAL | 0 refills | Status: DC

## 2018-07-30 MED ORDER — AMOXICILLIN 875 MG PO TABS: 875.0000 mg | ORAL_TABLET | Freq: Two times a day (BID) | ORAL | 0 refills | Status: DC

## 2018-07-30 NOTE — Progress Notes (Signed)
Virtual Visit via Video Note  I connected with Mary Knox on 07/30/18 at  2:00 PM EDT by a video enabled telemedicine application and verified that I am speaking with the correct person using two identifiers.   I discussed the limitations of evaluation and management by telemedicine and the availability of in person appointments. The patient expressed understanding and agreed to proceed.  History of Present Illness: Pt is home c/o sore throat.  She thinks she used the nystatin wrong but her throat is very sore.  No fever , no cough, no sob   Observations/Objective: Pt has no fever She has no way of checking bp  No chest pain , no sob   Assessment and Plan: 1. Oral thrush Refill nystatin swish and spit  - nystatin (MYCOSTATIN) 100000 UNIT/ML suspension; Take 5 mLs (500,000 Units total) by mouth 4 (four) times daily.  Dispense: 120 mL; Refill: 0  2. Pharyngitis, unspecified etiology Call or rto if symptoms do not improve - amoxicillin (AMOXIL) 875 MG tablet; Take 1 tablet (875 mg total) by mouth 2 (two) times daily.  Dispense: 20 tablet; Refill: 0   Follow Up Instructions:    I discussed the assessment and treatment plan with the patient. The patient was provided an opportunity to ask questions and all were answered. The patient agreed with the plan and demonstrated an understanding of the instructions.   The patient was advised to call back or seek an in-person evaluation if the symptoms worsen or if the condition fails to improve as anticipated.  I provided 25 minutes of non-face-to-face time during this encounter.   Ann Held, DO

## 2018-07-30 NOTE — Telephone Encounter (Signed)
appt set up

## 2018-08-10 ENCOUNTER — Encounter: Payer: Self-pay | Admitting: Psychiatry

## 2018-08-10 ENCOUNTER — Ambulatory Visit (INDEPENDENT_AMBULATORY_CARE_PROVIDER_SITE_OTHER): Payer: BC Managed Care – PPO | Admitting: Psychiatry

## 2018-08-10 DIAGNOSIS — F3112 Bipolar disorder, current episode manic without psychotic features, moderate: Secondary | ICD-10-CM | POA: Diagnosis not present

## 2018-08-10 DIAGNOSIS — F411 Generalized anxiety disorder: Secondary | ICD-10-CM | POA: Diagnosis not present

## 2018-08-10 MED ORDER — LAMOTRIGINE 200 MG PO TABS: 200.0000 mg | ORAL_TABLET | Freq: Every day | ORAL | 3 refills | Status: DC

## 2018-08-10 MED ORDER — ARIPIPRAZOLE 15 MG PO TABS: 7.5000 mg | ORAL_TABLET | Freq: Every day | ORAL | 3 refills | Status: DC

## 2018-08-10 NOTE — Progress Notes (Signed)
BRENNAN KARAM [id number] 1981-04-08 38 y.o.  Subjective:   Patient ID:  Mary Knox is a 38 y.o. (DOB 08-13-1980) female.  Chief Complaint:  Chief Complaint  Patient presents with  . Follow-up    bipolar disorder  . Anxiety    esp driving    HPI Mary Knox presents to the office today for follow-up of bipolarand anxiety.  One panic over Covid.  Better now. QT for 3 weeks.  Patient reports stable mood and denies depressed or irritable moods. No manic sx. Patient denies any recent difficulty with anxiety.  Patient denies difficulty with sleep initiation or maintenance. Denies appetite disturbance.  Patient reports that energy and motivation have been good.  Patient denies any difficulty with concentration.  Patient denies any suicidal ideation.   Last visit July.    Past Psychiatric Medication Trials:    Review of Systems:  Review of Systems  Neurological: Negative for tremors and weakness.    Medications: I have reviewed the patient's current medications.  Current Outpatient Medications  Medication Sig Dispense Refill  . albuterol (PROVENTIL HFA;VENTOLIN HFA) 108 (90 Base) MCG/ACT inhaler Inhale 2 puffs into the lungs as needed for wheezing or shortness of breath. 1 Inhaler 1  . ARIPiprazole (ABILIFY) 15 MG tablet Take 0.5 tablets (7.5 mg total) by mouth daily. 45 tablet 3  . Cholecalciferol (VITAMIN D3) 5000 units CAPS Take 1 capsule by mouth daily.    Marland Kitchen lamoTRIgine (LAMICTAL) 200 MG tablet Take 1 tablet (200 mg total) by mouth daily. 60 tablet 3  . levothyroxine (SYNTHROID, LEVOTHROID) 50 MCG tablet Take by mouth.    Marland Kitchen amoxicillin (AMOXIL) 875 MG tablet Take 1 tablet (875 mg total) by mouth 2 (two) times daily. (Patient not taking: Reported on 08/10/2018) 20 tablet 0  . beclomethasone (QVAR REDIHALER) 40 MCG/ACT inhaler Inhale 2 puffs into the lungs 2 (two) times daily. (Patient not taking: Reported on 08/10/2018) 10.6 g 5  . benzonatate (TESSALON) 100 MG capsule Take  1 capsule (100 mg total) by mouth 3 (three) times daily as needed. 30 capsule 0  . metFORMIN (GLUCOPHAGE) 500 MG tablet Take 1 tablet (500 mg total) by mouth daily with breakfast. (Patient not taking: Reported on 08/10/2018) 30 tablet 0  . nystatin (MYCOSTATIN) 100000 UNIT/ML suspension Take 5 mLs (500,000 Units total) by mouth 4 (four) times daily. (Patient not taking: Reported on 08/10/2018) 120 mL 0   No current facility-administered medications for this visit.     Medication Side Effects: None  Allergies: No Known Allergies  Past Medical History:  Diagnosis Date  . Anxiety   . Asthma   . Asthma   . B12 deficiency   . Back pain   . Bipolar 1 disorder ([place])   . Bipolar disorder ([place])   . Constipation   . Depression   . Depression   . Fatty liver   . Hashimoto's disease   . Lactose intolerance   . Overweight   . PCOS (polycystic ovarian syndrome)     Family History  Problem Relation Age of Onset  . Alcohol abuse Father   . Bipolar disorder Father        also OCD  . Obesity Father   . Sleep apnea Father   . Heart disease Father   . High blood pressure Father   . Diabetes Paternal Grandfather   . Stroke Paternal Grandfather   . Hypertension Other   . Parkinsonism Other   . Hypothyroidism Mother   .  Obesity Mother     Social History   Socioeconomic History  . Marital status: Single    Spouse name: Not on file  . Number of children: Not on file  . Years of education: Not on file  . Highest education level: Not on file  Occupational History  . Occupation: Higher education careers adviser  Social Needs  . Financial resource strain: Not on file  . Food insecurity:    Worry: Not on file    Inability: Not on file  . Transportation needs:    Medical: Not on file    Non-medical: Not on file  Tobacco Use  . Smoking status: Never Smoker  . Smokeless tobacco: Never Used  Substance and Sexual Activity  . Alcohol use: Yes    Comment:  rarely  . Drug use: No  . Sexual activity: Not on  file  Lifestyle  . Physical activity:    Days per week: Not on file    Minutes per session: Not on file  . Stress: Not on file  Relationships  . Social connections:    Talks on phone: Not on file    Gets together: Not on file    Attends religious service: Not on file    Active member of club or organization: Not on file    Attends meetings of clubs or organizations: Not on file    Relationship status: Not on file  . Intimate partner violence:    Fear of current or ex partner: Not on file    Emotionally abused: Not on file    Physically abused: Not on file    Forced sexual activity: Not on file  Other Topics Concern  . Not on file  Social History Narrative  . Not on file    Past Medical History, Surgical history, Social history, and Family history were reviewed and updated as appropriate.   Please see review of systems for further details on the patient's review from today.   Objective:   Physical Exam:  There were no vitals taken for this visit.  Physical Exam Neurological:     Mental Status: She is alert and oriented to person, place, and time.     Cranial Nerves: No dysarthria.  Psychiatric:        Attention and Perception: Attention normal.        Mood and Affect: Mood normal.        Speech: Speech normal.        Behavior: Behavior is cooperative.        Thought Content: Thought content normal. Thought content is not paranoid or delusional. Thought content does not include homicidal or suicidal ideation. Thought content does not include homicidal or suicidal plan.        Cognition and Memory: Cognition and memory normal.        Judgment: Judgment normal.     Lab Review:     Component Value Date/Time   NA 138 02/16/2018 1605   K 3.9 02/16/2018 1605   CL 100 02/16/2018 1605   CO2 31 02/16/2018 1605   GLUCOSE 95 02/16/2018 1605   BUN 15 02/16/2018 1605   CREATININE 0.97 02/16/2018 1605   CALCIUM 10.0 02/16/2018 1605   PROT 6.9 02/16/2018 1605   ALBUMIN 4.3  02/16/2018 1605   AST 14 02/16/2018 1605   ALT 28 02/16/2018 1605   ALKPHOS 119 (H) 02/16/2018 1605   BILITOT 0.3 02/16/2018 1605   GFRNONAA 77.98 03/23/2009 1454  Component Value Date/Time   WBC 12.2 (H) 03/24/2018 1009   WBC 13.0 (H) 11/12/2012 1435   RBC 4.46 03/24/2018 1009   RBC 4.64 11/12/2012 1435   HGB 14.1 03/24/2018 1009   HCT 40.0 03/24/2018 1009   PLT 334.0 11/12/2012 1435   MCV 90 03/24/2018 1009   MCH 31.6 03/24/2018 1009   MCH 30.8 03/29/2011 1348   MCHC 35.3 03/24/2018 1009   MCHC 33.8 11/12/2012 1435   RDW 12.8 03/24/2018 1009   LYMPHSABS 3.8 (H) 03/24/2018 1009   MONOABS 0.8 11/12/2012 1435   EOSABS 0.6 (H) 03/24/2018 1009   BASOSABS 0.1 03/24/2018 1009    No results found for: POCLITH, LITHIUM   No results found for: PHENYTOIN, PHENOBARB, VALPROATE, CBMZ   .res Assessment: Plan:    Bipolar 1 disorder with moderate mania ([place])  Anxiety state   Mary Knox is been stable over the last year with the exception of an attempt to reduce Abilify from 7.5 to 5 mg.  She became manic shortly thereafter but recognized it and we increased the Abilify back back to 7.5 mg daily.  We will also continue the lamotrigine 200 mg daily for bipolar disorder.  No med changes today  Discussed potential metabolic side effects associated with atypical antipsychotics, as well as potential risk for movement side effects. Advised pt to contact office if movement side effects occur.   Her driving anxiety is manageable  Follow-up 1 year  I connected with patient by a video enabled telemedicine application or telephone, with their informed consent, and verified patient privacy and that I am speaking with the correct person using two identifiers.  I was located at office and patient at home.  Hiram Comber, MD, DFAPA   Please see After Visit Summary for patient specific instructions.  No future appointments.  No orders of the defined types were placed in this encounter.      -------------------------------

## 2018-08-18 ENCOUNTER — Ambulatory Visit: Payer: BC Managed Care – PPO | Admitting: Endocrinology

## 2018-09-04 ENCOUNTER — Telehealth: Payer: Self-pay

## 2018-09-04 NOTE — Telephone Encounter (Signed)
Overdue for an appt. LVM requesting returned call. 

## 2018-09-30 ENCOUNTER — Telehealth: Payer: Self-pay

## 2018-09-30 NOTE — Telephone Encounter (Signed)
Canceled 08/18/18 appt. Called pt to reschedule appt. LVM requesting returned call.

## 2019-02-11 ENCOUNTER — Ambulatory Visit: Payer: BC Managed Care – PPO

## 2019-02-11 ENCOUNTER — Ambulatory Visit: Payer: BC Managed Care – PPO | Admitting: Family Medicine

## 2019-02-16 ENCOUNTER — Other Ambulatory Visit: Payer: Self-pay

## 2019-02-16 ENCOUNTER — Encounter: Payer: Self-pay | Admitting: Family Medicine

## 2019-02-16 ENCOUNTER — Ambulatory Visit (INDEPENDENT_AMBULATORY_CARE_PROVIDER_SITE_OTHER): Payer: BC Managed Care – PPO | Admitting: *Deleted

## 2019-02-16 DIAGNOSIS — Z23 Encounter for immunization: Secondary | ICD-10-CM

## 2019-02-16 NOTE — Progress Notes (Signed)
Patient here today for TDAP and flu vaccines.  Tdap vaccine given in left deltoid and flu vaccine given in right deltoid and patient tolerated both vaccines well.

## 2019-03-10 ENCOUNTER — Other Ambulatory Visit: Payer: Self-pay

## 2019-03-10 ENCOUNTER — Telehealth: Payer: Self-pay | Admitting: Psychiatry

## 2019-03-10 MED ORDER — ARIPIPRAZOLE 10 MG PO TABS: 10.0000 mg | ORAL_TABLET | Freq: Every day | ORAL | 1 refills | Status: DC

## 2019-03-10 NOTE — Telephone Encounter (Signed)
Left detailed information and new Rx submitted to her pharmacy.

## 2019-03-10 NOTE — Telephone Encounter (Signed)
Okay to increase Abilify to 10 mg daily #31 refill

## 2019-03-10 NOTE — Telephone Encounter (Signed)
Mary Knox called to inquire about increasing her Abilify. She said she is cycling and would like to increase the abilify from 7mg  to 10mg .  Would this be ok.  Pharmacy is [facility].  Next appt 08/12/19

## 2019-04-07 ENCOUNTER — Other Ambulatory Visit: Payer: Self-pay | Admitting: Family Medicine

## 2019-04-07 DIAGNOSIS — B370 Candidal stomatitis: Secondary | ICD-10-CM

## 2019-04-14 ENCOUNTER — Other Ambulatory Visit: Payer: Self-pay | Admitting: Psychiatry

## 2019-05-11 ENCOUNTER — Ambulatory Visit: Payer: BC Managed Care – PPO | Admitting: Internal Medicine

## 2019-05-11 ENCOUNTER — Other Ambulatory Visit: Payer: Self-pay

## 2019-05-11 ENCOUNTER — Ambulatory Visit (HOSPITAL_BASED_OUTPATIENT_CLINIC_OR_DEPARTMENT_OTHER)
Admission: RE | Admit: 2019-05-11 | Discharge: 2019-05-11 | Disposition: A | Discharge status: Home / Self Care | Payer: BC Managed Care – PPO | Source: Ambulatory Visit | Attending: Internal Medicine | Admitting: Internal Medicine

## 2019-05-11 VITALS — BP 130/71 | HR 71 | Temp 97.0°F | Resp 12 | Ht 67.0 in | Wt 290.0 lb

## 2019-05-11 DIAGNOSIS — M79641 Pain in right hand: Secondary | ICD-10-CM

## 2019-05-11 DIAGNOSIS — M25531 Pain in right wrist: Secondary | ICD-10-CM | POA: Diagnosis not present

## 2019-05-11 NOTE — Progress Notes (Signed)
Subjective:    Patient ID: Mary Knox, female    DOB: 1981-02-15, 39 y.o.   MRN: [id number]  DOS:  05/11/2019 Type of visit - description: acute A week ago she woke up with the right hand and wrist hurting. Symptoms gradually went away.  This morning, she again woke up with her right hand and wrist hurting, the pain this time is more severe, increases when she tries to use her hand.  Review of Systems No fever chills No actual injury, she wonders if she slept with the hand in a funny position No neck pain No paresthesias  Past Medical History:  Diagnosis Date  . Anxiety   . Asthma   . Asthma   . B12 deficiency   . Back pain   . Bipolar 1 disorder ([place])   . Bipolar disorder ([place])   . Constipation   . Depression   . Depression   . Fatty liver   . Hashimoto's disease   . Lactose intolerance   . Overweight   . PCOS (polycystic ovarian syndrome)     Past Surgical History:  Procedure Laterality Date  . WISDOM TOOTH EXTRACTION      Social History   Socioeconomic History  . Marital status: Single    Spouse name: Not on file  . Number of children: Not on file  . Years of education: Not on file  . Highest education level: Not on file  Occupational History  . Occupation: Higher education careers adviser  Tobacco Use  . Smoking status: Never Smoker  . Smokeless tobacco: Never Used  Substance and Sexual Activity  . Alcohol use: Yes    Comment:  rarely  . Drug use: No  . Sexual activity: Not on file  Other Topics Concern  . Not on file  Social History Narrative  . Not on file   Social Determinants of Health   Financial Resource Strain:   . Difficulty of Paying Living Expenses: Not on file  Food Insecurity:   . Worried About Charity fundraiser in the Last Year: Not on file  . Ran Out of Food in the Last Year: Not on file  Transportation Needs:   . Lack of Transportation (Medical): Not on file  . Lack of Transportation (Non-Medical): Not on file  Physical Activity:   . Days  of Exercise per Week: Not on file  . Minutes of Exercise per Session: Not on file  Stress:   . Feeling of Stress : Not on file  Social Connections:   . Frequency of Communication with Friends and Family: Not on file  . Frequency of Social Gatherings with Friends and Family: Not on file  . Attends Religious Services: Not on file  . Active Member of Clubs or Organizations: Not on file  . Attends Archivist Meetings: Not on file  . Marital Status: Not on file  Intimate Partner Violence:   . Fear of Current or Ex-Partner: Not on file  . Emotionally Abused: Not on file  . Physically Abused: Not on file  . Sexually Abused: Not on file      Allergies as of 05/11/2019   No Known Allergies     Medication List       Accurate as of May 11, 2019  2:48 PM. If you have any questions, ask your nurse or doctor.        STOP taking these medications   amoxicillin 875 MG tablet Commonly known as: AMOXIL Stopped by: [employer]  Larose Kells, MD   beclomethasone 40 MCG/ACT inhaler Commonly known as: Engineer, agricultural Stopped by: Kathlene November, MD   benzonatate 100 MG capsule Commonly known as: TESSALON Stopped by: Kathlene November, MD   nystatin 100000 UNIT/ML suspension Commonly known as: MYCOSTATIN Stopped by: Kathlene November, MD     TAKE these medications   albuterol 108 (90 Base) MCG/ACT inhaler Commonly known as: VENTOLIN HFA Inhale 2 puffs into the lungs as needed for wheezing or shortness of breath.   ARIPiprazole 10 MG tablet Commonly known as: ABILIFY Take 1 tablet (10 mg total) by mouth daily.   lamoTRIgine 200 MG tablet Commonly known as: LAMICTAL TAKE ONE TABLET BY MOUTH DAILY   levothyroxine 50 MCG tablet Commonly known as: SYNTHROID Take by mouth.   metFORMIN 500 MG tablet Commonly known as: GLUCOPHAGE Take 1 tablet (500 mg total) by mouth daily with breakfast.   Vitamin D3 125 MCG (5000 UT) Caps Take 1 capsule by mouth daily.           Objective:   Physical Exam BP 130/71  (BP Location: Right Arm, Cuff Size: Large)   Pulse 71   Temp (!) 97 F (36.1 C) (Temporal)   Resp 12   Ht 5\' 7"  (1.702 m)   Wt 290 lb (131.5 kg)   SpO2 98%   BMI 45.42 kg/m  General:   Well developed, NAD, BMI noted. HEENT:  Normocephalic . Face symmetric, atraumatic MSK: Left hand and wrist normal Right hand and wrist:  Mildly puffy throughout, no deformities, skin is intact and not red or warm. Palpation of the wrist hand with no joint synovitis, perhaps slightly puffy at the wrist. Motor exam is normal. Elbows bilaterally: Normal Skin: Not pale. Not jaundice Neurologic:  alert & oriented X3.  Speech normal, gait appropriate for age and unassisted Psych--  Cognition and judgment appear intact.  Cooperative with normal attention span and concentration.  Behavior appropriate. No anxious or depressed appearing.      Assessment       39 year old female, PMH includes bipolar, exercise induced asthma, thyroid disease, overweight, not on BCP   presents with  Right wrist and hand pain: As described above, on exam there is some soft tissue swelling but no redness, warmness or cellulitis type of changes. No synovitis per se, perhaps mild swelling at the right wrist but otherwise the hand joints are not swollen or tender. Unclear etiology, tendinitis?  Plan: X-ray, NSAIDs with GI precautions, Ortho referral   This visit occurred during the SARS-CoV-2 public health emergency.  Safety protocols were in place, including screening questions prior to the visit, additional usage of staff PPE, and extensive cleaning of exam room while observing appropriate contact time as indicated for disinfecting solutions.

## 2019-05-11 NOTE — Patient Instructions (Signed)
Get the x-ray downstairs  Take ibuprofen 200 mg OTC: 2 tablets every 8 hours as needed only.  Always take it with food because may cause gastritis and ulcers.  If you notice nausea, stomach pain, change in the color of stools --->  Stop the medicine and let us know   Ice the area twice a day  Call if fever, chills or redness  We are referring you to a orthopedic doctor

## 2019-06-04 ENCOUNTER — Telehealth: Payer: Self-pay | Admitting: Psychiatry

## 2019-06-04 NOTE — Telephone Encounter (Signed)
error 

## 2019-06-08 ENCOUNTER — Other Ambulatory Visit: Payer: Self-pay | Admitting: Psychiatry

## 2019-08-12 ENCOUNTER — Other Ambulatory Visit: Payer: Self-pay

## 2019-08-12 ENCOUNTER — Ambulatory Visit (INDEPENDENT_AMBULATORY_CARE_PROVIDER_SITE_OTHER): Payer: BC Managed Care – PPO | Admitting: Psychiatry

## 2019-08-12 ENCOUNTER — Encounter: Payer: Self-pay | Admitting: Psychiatry

## 2019-08-12 DIAGNOSIS — F319 Bipolar disorder, unspecified: Secondary | ICD-10-CM | POA: Diagnosis not present

## 2019-08-12 DIAGNOSIS — F411 Generalized anxiety disorder: Secondary | ICD-10-CM

## 2019-08-12 MED ORDER — ARIPIPRAZOLE 10 MG PO TABS: 10.0000 mg | ORAL_TABLET | Freq: Every day | ORAL | 3 refills | Status: DC

## 2019-08-12 MED ORDER — LAMOTRIGINE 200 MG PO TABS: 200.0000 mg | ORAL_TABLET | Freq: Every day | ORAL | 3 refills | Status: DC

## 2019-08-12 NOTE — Progress Notes (Signed)
Mary Knox [id number] [birth year] 39 y.o.  Subjective:   Patient ID:  Mary Knox is a 39 y.o. (DOB January 24, 1981) female.  Chief Complaint:  Chief Complaint  Patient presents with  . Follow-up  . mood swings    HPI Mary Knox presents to the office today for follow-up of bipolarand anxiety.    Last visit April 2020.  She was doing well no meds were changed.  Asked to increase Abilify to 10 and mood more stable and less depression.  No sign panic or anxiiety.  Patient reports stable mood and denies depressed or irritable moods. No manic sx. Patient denies any recent difficulty with anxiety.  Patient denies difficulty with sleep initiation or maintenance. Denies appetite disturbance.  Patient reports that energy and motivation have been good.  Patient denies any difficulty with concentration.  Patient denies any suicidal ideation.   Last visit July.    Past Psychiatric Medication Trials:    Review of Systems:  Review of Systems  Neurological: Negative for tremors and weakness.    Medications: I have reviewed the patient's current medications.  Current Outpatient Medications  Medication Sig Dispense Refill  . albuterol (PROVENTIL HFA;VENTOLIN HFA) 108 (90 Base) MCG/ACT inhaler Inhale 2 puffs into the lungs as needed for wheezing or shortness of breath. 1 Inhaler 1  . ARIPiprazole (ABILIFY) 10 MG tablet Take 1 tablet (10 mg total) by mouth daily. 90 tablet 3  . Cholecalciferol (VITAMIN D3) 5000 units CAPS Take 1 capsule by mouth daily.    Marland Kitchen ibuprofen (ADVIL) 200 MG tablet Take 2 tablets (400 mg total) by mouth every 8 (eight) hours as needed for moderate pain.    Marland Kitchen lamoTRIgine (LAMICTAL) 200 MG tablet Take 1 tablet (200 mg total) by mouth daily. 90 tablet 3  . levothyroxine (SYNTHROID, LEVOTHROID) 50 MCG tablet Take by mouth.    . metFORMIN (GLUCOPHAGE) 500 MG tablet Take 1 tablet (500 mg total) by mouth daily with breakfast. 30 tablet 0   No current  facility-administered medications for this visit.    Medication Side Effects: None, sun sensitivity  Allergies: No Known Allergies  Past Medical History:  Diagnosis Date  . Anxiety   . Asthma   . Asthma   . B12 deficiency   . Back pain   . Bipolar 1 disorder ([place])   . Bipolar disorder ([place])   . Constipation   . Depression   . Depression   . Fatty liver   . Hashimoto's disease   . Lactose intolerance   . Overweight   . PCOS (polycystic ovarian syndrome)     Family History  Problem Relation Age of Onset  . Alcohol abuse Father   . Bipolar disorder Father        also OCD  . Obesity Father   . Sleep apnea Father   . Heart disease Father   . High blood pressure Father   . Diabetes Paternal Grandfather   . Stroke Paternal Grandfather   . Hypertension Other   . Parkinsonism Other   . Hypothyroidism Mother   . Obesity Mother     Social History   Socioeconomic History  . Marital status: Single    Spouse name: Not on file  . Number of children: Not on file  . Years of education: Not on file  . Highest education level: Not on file  Occupational History  . Occupation: Higher education careers adviser  Tobacco Use  . Smoking status: Never Smoker  . Smokeless tobacco: Never  Used  Substance and Sexual Activity  . Alcohol use: Yes    Comment:  rarely  . Drug use: No  . Sexual activity: Not on file  Other Topics Concern  . Not on file  Social History Narrative  . Not on file   Social Determinants of Health   Financial Resource Strain:   . Difficulty of Paying Living Expenses:   Food Insecurity:   . Worried About Charity fundraiser in the Last Year:   . Arboriculturist in the Last Year:   Transportation Needs:   . Film/video editor (Medical):   Marland Kitchen Lack of Transportation (Non-Medical):   Physical Activity:   . Days of Exercise per Week:   . Minutes of Exercise per Session:   Stress:   . Feeling of Stress :   Social Connections:   . Frequency of Communication with  Friends and Family:   . Frequency of Social Gatherings with Friends and Family:   . Attends Religious Services:   . Active Member of Clubs or Organizations:   . Attends Archivist Meetings:   Marland Kitchen Marital Status:   Intimate Partner Violence:   . Fear of Current or Ex-Partner:   . Emotionally Abused:   Marland Kitchen Physically Abused:   . Sexually Abused:     Past Medical History, Surgical history, Social history, and Family history were reviewed and updated as appropriate.   Please see review of systems for further details on the patient's review from today.   Objective:   Physical Exam:  There were no vitals taken for this visit.  Physical Exam Constitutional:      Appearance: She is obese.  Neurological:     Mental Status: She is alert and oriented to person, place, and time.     Cranial Nerves: No dysarthria.  Psychiatric:        Attention and Perception: Attention normal.        Mood and Affect: Mood normal.        Speech: Speech normal.        Behavior: Behavior is cooperative.        Thought Content: Thought content normal. Thought content is not paranoid or delusional. Thought content does not include homicidal or suicidal ideation. Thought content does not include homicidal or suicidal plan.        Cognition and Memory: Cognition and memory normal.        Judgment: Judgment normal.     Lab Review:     Component Value Date/Time   NA 138 02/16/2018 1605   K 3.9 02/16/2018 1605   CL 100 02/16/2018 1605   CO2 31 02/16/2018 1605   GLUCOSE 95 02/16/2018 1605   BUN 15 02/16/2018 1605   CREATININE 0.97 02/16/2018 1605   CALCIUM 10.0 02/16/2018 1605   PROT 6.9 02/16/2018 1605   ALBUMIN 4.3 02/16/2018 1605   AST 14 02/16/2018 1605   ALT 28 02/16/2018 1605   ALKPHOS 119 (H) 02/16/2018 1605   BILITOT 0.3 02/16/2018 1605   GFRNONAA 77.98 03/23/2009 1454       Component Value Date/Time   WBC 12.2 (H) 03/24/2018 1009   WBC 13.0 (H) 11/12/2012 1435   RBC 4.46  03/24/2018 1009   RBC 4.64 11/12/2012 1435   HGB 14.1 03/24/2018 1009   HCT 40.0 03/24/2018 1009   PLT 334.0 11/12/2012 1435   MCV 90 03/24/2018 1009   MCH 31.6 03/24/2018 1009   MCH 30.8 03/29/2011 1348  MCHC 35.3 03/24/2018 1009   MCHC 33.8 11/12/2012 1435   RDW 12.8 03/24/2018 1009   LYMPHSABS 3.8 (H) 03/24/2018 1009   MONOABS 0.8 11/12/2012 1435   EOSABS 0.6 (H) 03/24/2018 1009   BASOSABS 0.1 03/24/2018 1009    No results found for: POCLITH, LITHIUM   No results found for: PHENYTOIN, PHENOBARB, VALPROATE, CBMZ   .res Assessment: Plan:    Bipolar I disorder ([place]) - Plan: lamoTRIgine (LAMICTAL) 200 MG tablet, ARIPiprazole (ABILIFY) 10 MG tablet  Anxiety state - Plan: ARIPiprazole (ABILIFY) 10 MG tablet   Mary Knox is been stable over the last year with the exception of an attempt to reduce Abilify from 7.5 to 5 mg.  She became manic shortly thereafter but recognized it and we increased the Abilify back back to 7.5 mg daily.  We will also continue the lamotrigine 200 mg daily for bipolar disorder.  No med changes today. On these for 6 years.  Disc recent study on Ozempic for weight loss and option metformin. She's starting metformin  Discussed potential metabolic side effects associated with atypical antipsychotics, as well as potential risk for movement side effects. Advised pt to contact office if movement side effects occur.   Her driving anxiety is manageable  Follow-up 1 year  Hiram Comber, MD, DFAPA   Please see After Visit Summary for patient specific instructions.  No future appointments.  No orders of the defined types were placed in this encounter.     -------------------------------

## 2019-11-26 ENCOUNTER — Telehealth: Payer: Self-pay | Admitting: Nurse Practitioner

## 2019-11-26 NOTE — Telephone Encounter (Signed)
Received a new hem referral from Dr. Lysle Rubens at [facility] for elevated wbc and platelets. Mary Knox has been cld and scheduled to see Lacie on 7/28 at 1:45pm. Pt aware to arrive 15 minutes early.

## 2019-12-01 ENCOUNTER — Encounter: Payer: Self-pay | Admitting: Nurse Practitioner

## 2019-12-01 ENCOUNTER — Other Ambulatory Visit: Payer: Self-pay

## 2019-12-01 ENCOUNTER — Inpatient Hospital Stay: Payer: BC Managed Care – PPO

## 2019-12-01 ENCOUNTER — Inpatient Hospital Stay: Payer: BC Managed Care – PPO | Attending: Nurse Practitioner | Admitting: Nurse Practitioner

## 2019-12-01 VITALS — BP 151/86 | HR 80 | Temp 97.2°F | Resp 20 | Ht 67.0 in | Wt 294.9 lb

## 2019-12-01 DIAGNOSIS — D72829 Elevated white blood cell count, unspecified: Secondary | ICD-10-CM

## 2019-12-01 DIAGNOSIS — Z79899 Other long term (current) drug therapy: Secondary | ICD-10-CM | POA: Insufficient documentation

## 2019-12-01 DIAGNOSIS — D75839 Thrombocytosis, unspecified: Secondary | ICD-10-CM

## 2019-12-01 DIAGNOSIS — D473 Essential (hemorrhagic) thrombocythemia: Secondary | ICD-10-CM

## 2019-12-01 DIAGNOSIS — D696 Thrombocytopenia, unspecified: Secondary | ICD-10-CM | POA: Insufficient documentation

## 2019-12-01 DIAGNOSIS — R7989 Other specified abnormal findings of blood chemistry: Secondary | ICD-10-CM | POA: Diagnosis not present

## 2019-12-01 DIAGNOSIS — Z791 Long term (current) use of non-steroidal anti-inflammatories (NSAID): Secondary | ICD-10-CM | POA: Insufficient documentation

## 2019-12-01 DIAGNOSIS — K760 Fatty (change of) liver, not elsewhere classified: Secondary | ICD-10-CM | POA: Diagnosis not present

## 2019-12-01 DIAGNOSIS — Z7984 Long term (current) use of oral hypoglycemic drugs: Secondary | ICD-10-CM | POA: Insufficient documentation

## 2019-12-01 DIAGNOSIS — Z803 Family history of malignant neoplasm of breast: Secondary | ICD-10-CM | POA: Insufficient documentation

## 2019-12-01 DIAGNOSIS — J45909 Unspecified asthma, uncomplicated: Secondary | ICD-10-CM | POA: Diagnosis not present

## 2019-12-01 LAB — TECHNOLOGIST SMEAR REVIEW

## 2019-12-01 LAB — CBC WITH DIFFERENTIAL (CANCER CENTER ONLY)
Abs Immature Granulocytes: 0.03 10*3/uL (ref 0.00–0.07)
Basophils Absolute: 0.1 10*3/uL (ref 0.0–0.1)
Basophils Relative: 1 %
Eosinophils Absolute: 0.6 10*3/uL — ABNORMAL HIGH (ref 0.0–0.5)
Eosinophils Relative: 5 %
HCT: 42.8 % (ref 36.0–46.0)
Hemoglobin: 14.4 g/dL (ref 12.0–15.0)
Immature Granulocytes: 0 %
Lymphocytes Relative: 31 %
Lymphs Abs: 3.7 10*3/uL (ref 0.7–4.0)
MCH: 30.7 pg (ref 26.0–34.0)
MCHC: 33.6 g/dL (ref 30.0–36.0)
MCV: 91.3 fL (ref 80.0–100.0)
Monocytes Absolute: 1 10*3/uL (ref 0.1–1.0)
Monocytes Relative: 8 %
Neutro Abs: 6.8 10*3/uL (ref 1.7–7.7)
Neutrophils Relative %: 55 %
Platelet Count: 413 10*3/uL — ABNORMAL HIGH (ref 150–400)
RBC: 4.69 MIL/uL (ref 3.87–5.11)
RDW: 12.8 % (ref 11.5–15.5)
WBC Count: 12.2 10*3/uL — ABNORMAL HIGH (ref 4.0–10.5)
nRBC: 0 % (ref 0.0–0.2)

## 2019-12-01 LAB — SAVE SMEAR(SSMR), FOR PROVIDER SLIDE REVIEW

## 2019-12-01 NOTE — Progress Notes (Addendum)
[facility]  [phone] [phone]  Clinic New consult Note   Patient Care Team: Lowne Chase, Yvonne R, DO as PCP - General Trujillo, Jamie, MD as Referring Physician (Endocrinology) 12/01/2019  CHIEF COMPLAINTS/PURPOSE OF CONSULTATION:  Leukocytosis and thrombocytosis, referred by Dr. Karrar Husain  HISTORY OF PRESENTING ILLNESS:  Mary Knox 39 y.o. female with history of asthma, allergic rhinitis, bipolar, obesity, PCOS, hypothyroidism, hashimoto's thyroiditis, and remote history of B12 deficiency was referred for leukocytosis and thrombocytosis.  She was found to have abnormal CBC in 03/2009 with WBC 12.5.  WBC was 16.0 in 03/2011, 13.0 in 2014, and 12.2 in 2019.  Neutrophils have been intermittently elevated.  She had mild thrombocytopenia in 03/2011 with platelets 412.  B12 has been in normal range to slightly above normal, 563 on 03/2018. CBC without differential on 05/13/2019 showed WBC 15.3 platelet 404, normal hemoglobin and MCV. Repeat CBC on 11/23/19 showed WBC 11.9 and PLT 405. The absolute lymphocyte count has been intermittently mildly elevated, but not lymph percentage. Her asthma is well controlled. She denies chronic or recurrent infections. She has many allergies and is receiving allergy shots.  Socially, she lives at home with her 17-year-old daughter who is her only child.  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  She is independent with ADLs.  She exercises with a trainer.  She drinks alcohol socially once per month, denies tobacco or other drug use.  She is up-to-date on her Pap smear, has not had colonoscopy or mammogram yet.  Denies family history of blood disorder or blood cancer.  Maternal aunt was just diagnosed with breast cancer in her 60s.  Ms. Bonfiglio presents today by herself.  She recently had blood in her urine and stool for 1-2 months that resolved after completing a course of antibiotics. She wonders if she might have a bladder  infection. She has mild urinary incontinence. She has irregular periods, 2/year.  She is being followed by OB/GYN, planning to start progestin soon.  She also has a urology consult upcoming on 8/3.  She denies unintentional weight loss, fever, night sweats, adenopathy, asthma exacerbation.  Her mood is stable on Lamictal and Abilify which she has been on for the past 8 years.  MEDICAL HISTORY:  Past Medical History:  Diagnosis Date  . Anxiety   . Asthma   . Asthma   . B12 deficiency   . Back pain   . Bipolar 1 disorder (HCC)   . Bipolar disorder (HCC)   . Constipation   . Depression   . Depression   . Fatty liver   . Hashimoto's disease   . Lactose intolerance   . Overweight   . PCOS (polycystic ovarian syndrome)     SURGICAL HISTORY: Past Surgical History:  Procedure Laterality Date  . WISDOM TOOTH EXTRACTION      SOCIAL HISTORY: Social History   Socioeconomic History  . Marital status: Single    Spouse name: Not on file  . Number of children: Not on file  . Years of education: Not on file  . Highest education level: Not on file  Occupational History  . Occupation: ESL Teacher  Tobacco Use  . Smoking status: Never Smoker  . Smokeless tobacco: Never Used  Substance and Sexual Activity  . Alcohol use: Yes    Comment:  rarely  . Drug use: No  . Sexual activity: Not on file  Other Topics Concern  . Not on file  Social History Narrative  .   Not on file   Social Determinants of Health   Financial Resource Strain:   . Difficulty of Paying Living Expenses:   Food Insecurity:   . Worried About Running Out of Food in the Last Year:   . Ran Out of Food in the Last Year:   Transportation Needs:   . Lack of Transportation (Medical):   . Lack of Transportation (Non-Medical):   Physical Activity:   . Days of Exercise per Week:   . Minutes of Exercise per Session:   Stress:   . Feeling of Stress :   Social Connections:   . Frequency of Communication with Friends  and Family:   . Frequency of Social Gatherings with Friends and Family:   . Attends Religious Services:   . Active Member of Clubs or Organizations:   . Attends Club or Organization Meetings:   . Marital Status:   Intimate Partner Violence:   . Fear of Current or Ex-Partner:   . Emotionally Abused:   . Physically Abused:   . Sexually Abused:     FAMILY HISTORY: Family History  Problem Relation Age of Onset  . Alcohol abuse Father   . Bipolar disorder Father        also OCD  . Obesity Father   . Sleep apnea Father   . Heart disease Father   . High blood pressure Father   . Diabetes Paternal Grandfather   . Stroke Paternal Grandfather   . Hypertension Other   . Parkinsonism Other   . Hypothyroidism Mother   . Obesity Mother     ALLERGIES:  has No Known Allergies.  MEDICATIONS:  Current Outpatient Medications  Medication Sig Dispense Refill  . albuterol (PROVENTIL HFA;VENTOLIN HFA) 108 (90 Base) MCG/ACT inhaler Inhale 2 puffs into the lungs as needed for wheezing or shortness of breath. 1 Inhaler 1  . ARIPiprazole (ABILIFY) 10 MG tablet Take 1 tablet (10 mg total) by mouth daily. 90 tablet 3  . Cholecalciferol (VITAMIN D3) 5000 units CAPS Take 1 capsule by mouth daily.    . lamoTRIgine (LAMICTAL) 200 MG tablet Take 1 tablet (200 mg total) by mouth daily. 90 tablet 3  . levothyroxine (SYNTHROID, LEVOTHROID) 50 MCG tablet Take by mouth.    . metFORMIN (GLUCOPHAGE) 500 MG tablet Take 1 tablet (500 mg total) by mouth daily with breakfast. 30 tablet 0  . ibuprofen (ADVIL) 200 MG tablet Take 2 tablets (400 mg total) by mouth every 8 (eight) hours as needed for moderate pain. (Patient not taking: Reported on 12/01/2019)     No current facility-administered medications for this visit.    REVIEW OF SYSTEMS:   Constitutional: Denies fevers, chills, abnormal night sweats, or unintentional weight loss Eyes: Denies blurriness of vision, double vision or watery eyes Ears, nose, mouth,  throat, and face: Denies mucositis or sore throat Respiratory: Denies cough, dyspnea or wheezes (+) controlled asthma Cardiovascular: Denies palpitation, chest discomfort or lower extremity swelling Gastrointestinal:  Denies nausea, heartburn or change in bowel habits (+) recent blood in stool GU/GYN: (+) PCOS (+) irregular menses (+) recent hematuria s/p antibiotics Skin: Denies abnormal skin rashes Lymphatics: Denies new lymphadenopathy or easy bruising Neurological:Denies numbness, tingling or new weaknesses  Behavioral/Psych: Mood is stable, no new changes (+) bipolar, controlled All other systems were reviewed with the patient and are negative.  PHYSICAL EXAMINATION: ECOG PERFORMANCE STATUS: 0 - Asymptomatic  Vitals:   12/01/19 1344  BP: (!) 151/86  Pulse: 80  Resp: 20  Temp: (!)   97.2 F (36.2 C)  SpO2: 100%   Filed Weights   12/01/19 1344  Weight: (!) 294 lb 14.4 oz (133.8 kg)    GENERAL:alert, no distress and comfortable SKIN: No rash to exposed skin EYES: sclera clear NECK: Without mass LYMPH:  no palpable cervical or supraclavicular lymphadenopathy LUNGS: clear with normal breathing effort HEART: regular rate & rhythm, no lower extremity edema ABDOMEN:abdomen soft, non-tender and normal bowel sounds.  No hepatosplenomegaly Musculoskeletal:no cyanosis of digits and no clubbing  PSYCH: alert & oriented x 3 with fluent speech NEURO: no focal motor/sensory deficits  LABORATORY DATA:  I have reviewed the data as listed CBC Latest Ref Rng & Units 12/01/2019 03/24/2018 11/12/2012  WBC 4.0 - 10.5 K/uL 12.2(H) 12.2(H) 13.0(H)  Hemoglobin 12.0 - 15.0 g/dL 14.4 14.1 14.6  Hematocrit 36 - 46 % 42.8 40.0 43.4  Platelets 150 - 400 K/uL 413(H) - 334.0     RADIOGRAPHIC STUDIES: I have personally reviewed the radiological images as listed and agreed with the findings in the report. No results found.  ASSESSMENT & PLAN: 39 year old female with asthma, allergies, Hashimoto's  thyroiditis, PCOS  1.  Mild Leukocytosis and thrombocytosis -We reviewed her historic labs and outside medical record in detail with the patient today.   -She has chronic mild leukocytosis with WBC up to 16.0 since at least 2010 with periodic mild thrombocytosis.  No other CBC abnormalities -We reviewed common causes of abnormal blood counts -She does not have clinical presentation or lab evidence of CLL -She is on mood stabilizers that can affect blood counts -Abd [place] in 2010 showed normal spleen -Her chronic mild leukocytosis and thrombocytosis are likely benign, reactive to her asthma, chronic allergies, and possibly her bipolar medication.  She also had a recent UTI which may contribute -Her repeat CBC today shows WBC 12.2 which is her baseline, platelet 413, differential is unremarkable.  The smear is pending review -We are not recommending any additional work-up at this time.  She can continue follow-up with her PCP.   -Follow-up is open for now, will can see her in the future if needed for worsening labs or other heme/onc concerns.  2. UTI -She reports 1-2 months of blood in urine and stool -S/p Cipro, bleeding resolved; however, she feels like she might have a bladder infection -Urology consult with Dr. Vikki Ports on 8/3 -I encouraged her to monitor for recurrent rectal bleeding at which time she will need referral to GI  3. Health maintenance -She is up-to-date on Pap smear, has not had colonoscopy or mammogram yet -I reviewed the recommendations for age-appropriate cancer screenings.  She will call to make an appointment for mammogram.   -She understands if she has recurrent rectal bleeding she will likely need to be referred for colonoscopy, can follow-up with PCP on this  PLAN: -Lab today with smear review -No additional work up recommended -F/u with PCP -F/u with [place] as needed in the future   All questions were answered. The patient knows to call the clinic with any problems,  questions or concerns.     Alla Feeling, NP 12/01/19   Addendum  I have seen the patient, examined her. I agree with the assessment and and plan and have edited the notes.   I discussed her previous lab and recent CBC from her PCP. Overall she has chronic and mild leukocytosis since 2010, with normal differentiation, and a mild elevated eosinophil.  This is likely reactive, secondary to her asthma and allergy. Her  mild thrombocytopenia is also likely reactive.  Is a possibility including reactions to her psych medications (Abilify and Lamictal). She is clinically asymptomatic.  She had abdominal ultrasound in 2010 which showed unremarkable spleen and liver. I have very low suspicion this is primary bone marrow disease, such as MPN or chronic leukemia.  We will repeat her CBC and differential today, and review her peripheral blood smear.  I do not think she needs further work-up such as JAK2, BCR/ABL genetic testing or bone marrow biopsy.  If her blood smear is unremarkable, I recommend her follow-up with her primary care physician with annual CBC and differential.  I will see her as needed in the future.  Yan Feng  12/01/2019  

## 2019-12-02 ENCOUNTER — Telehealth: Payer: Self-pay | Admitting: Nurse Practitioner

## 2019-12-02 NOTE — Telephone Encounter (Signed)
F/u as needed per 7/28 los

## 2019-12-17 ENCOUNTER — Other Ambulatory Visit: Payer: Self-pay | Admitting: Nurse Practitioner

## 2019-12-17 DIAGNOSIS — Z1231 Encounter for screening mammogram for malignant neoplasm of breast: Secondary | ICD-10-CM

## 2019-12-28 ENCOUNTER — Ambulatory Visit: Payer: BC Managed Care – PPO

## 2020-08-10 ENCOUNTER — Ambulatory Visit: Payer: BC Managed Care – PPO | Admitting: Psychiatry

## 2020-08-14 ENCOUNTER — Other Ambulatory Visit: Payer: Self-pay | Admitting: Psychiatry

## 2020-08-14 DIAGNOSIS — F319 Bipolar disorder, unspecified: Secondary | ICD-10-CM

## 2020-08-14 NOTE — Telephone Encounter (Signed)
Pt called requesting refill for Lamotrigine. Ran out yesterday. Has apt 4/13. [facility] [place]

## 2020-08-16 ENCOUNTER — Ambulatory Visit (INDEPENDENT_AMBULATORY_CARE_PROVIDER_SITE_OTHER): Payer: BC Managed Care – PPO | Admitting: Psychiatry

## 2020-08-16 ENCOUNTER — Other Ambulatory Visit: Payer: Self-pay

## 2020-08-16 ENCOUNTER — Encounter: Payer: Self-pay | Admitting: Psychiatry

## 2020-08-16 DIAGNOSIS — F319 Bipolar disorder, unspecified: Secondary | ICD-10-CM | POA: Diagnosis not present

## 2020-08-16 DIAGNOSIS — F411 Generalized anxiety disorder: Secondary | ICD-10-CM

## 2020-08-16 MED ORDER — ARIPIPRAZOLE 10 MG PO TABS: 10.0000 mg | ORAL_TABLET | Freq: Every day | ORAL | 3 refills | Status: DC

## 2020-08-16 MED ORDER — LAMOTRIGINE 200 MG PO TABS: 200.0000 mg | ORAL_TABLET | Freq: Every day | ORAL | 3 refills | Status: DC

## 2020-08-16 NOTE — Progress Notes (Signed)
Mary Knox [id number] October 26, 1980 40 y.o.  Subjective:   Patient ID:  Mary Knox is a 40 y.o. (DOB 1981-03-03) female.  Chief Complaint:  Chief Complaint  Patient presents with  . Follow-up    HPI Mary Knox presents to the office today for follow-up of bipolarand anxiety.    visit April 2020.  She was doing well no meds were changed.  April 2021 visit with the following noted: Asked to increase Abilify to 10 and mood more stable and less depression.  No sign panic or anxiiety.  08/16/2020 appointment with the following noted: Fine and normal. No Covid. Ran out of meds for 2 days and feels a little off.   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   D going to college.   Stayed on Abilify 10 mg daily and lamotrigine 200 mg daily.  Patient reports stable mood and denies depressed or irritable moods. No manic sx. Patient denies any recent difficulty with anxiety.  Patient denies difficulty with sleep initiation or maintenance. Denies appetite disturbance.  Patient reports that energy and motivation have been good.  Patient denies any difficulty with concentration.  Patient denies any suicidal ideation.  Past Psychiatric Medication Trials: Abilify 5-10 Lamotrigine 200   Review of Systems:  Review of Systems  Cardiovascular: Negative for palpitations.  Neurological: Negative for tremors and weakness.    Medications: I have reviewed the patient's current medications.  Current Outpatient Medications  Medication Sig Dispense Refill  . albuterol (PROVENTIL HFA;VENTOLIN HFA) 108 (90 Base) MCG/ACT inhaler Inhale 2 puffs into the lungs as needed for wheezing or shortness of breath. 1 Inhaler 1  . Cholecalciferol (VITAMIN D3) 5000 units CAPS Take 1 capsule by mouth daily.    Marland Kitchen ibuprofen (ADVIL) 200 MG tablet Take 400 mg by mouth every 8 (eight) hours as needed for moderate pain.    Marland Kitchen levothyroxine (SYNTHROID, LEVOTHROID) 50 MCG tablet Take by mouth.    .  metFORMIN (GLUCOPHAGE) 500 MG tablet Take 1 tablet (500 mg total) by mouth daily with breakfast. 30 tablet 0  . ARIPiprazole (ABILIFY) 10 MG tablet Take 1 tablet (10 mg total) by mouth daily. 90 tablet 3  . lamoTRIgine (LAMICTAL) 200 MG tablet Take 1 tablet (200 mg total) by mouth daily. 90 tablet 3   No current facility-administered medications for this visit.    Medication Side Effects: None, sun sensitivity  Allergies: No Known Allergies  Past Medical History:  Diagnosis Date  . Anxiety   . Asthma   . Asthma   . B12 deficiency   . Back pain   . Bipolar 1 disorder ([place])   . Bipolar disorder ([place])   . Constipation   . Depression   . Depression   . Fatty liver   . Hashimoto's disease   . Lactose intolerance   . Overweight   . PCOS (polycystic ovarian syndrome)     Family History  Problem Relation Age of Onset  . Alcohol abuse Father   . Bipolar disorder Father        also OCD  . Obesity Father   . Sleep apnea Father   . Heart disease Father   . High blood pressure Father   . Diabetes Paternal Grandfather   . Stroke Paternal Grandfather   . Hypertension Other   . Parkinsonism Other   . Hypothyroidism Mother   . Obesity Mother     Social History   Socioeconomic History  . Marital status: Single  Spouse name: Not on file  . Number of children: Not on file  . Years of education: Not on file  . Highest education level: Not on file  Occupational History  . Occupation: Higher education careers adviser  Tobacco Use  . Smoking status: Never Smoker  . Smokeless tobacco: Never Used  Substance and Sexual Activity  . Alcohol use: Yes    Comment:  rarely  . Drug use: No  . Sexual activity: Not on file  Other Topics Concern  . Not on file  Social History Narrative  . Not on file   Social Determinants of Health   Financial Resource Strain: Not on file  Food Insecurity: Not on file  Transportation Needs: Not on file  Physical Activity: Not on file  Stress: Not on file  Social  Connections: Not on file  Intimate Partner Violence: Not on file    Past Medical History, Surgical history, Social history, and Family history were reviewed and updated as appropriate.   Please see review of systems for further details on the patient's review from today.   Objective:   Physical Exam:  There were no vitals taken for this visit.  Physical Exam Constitutional:      Appearance: She is obese.  Neurological:     Mental Status: She is alert and oriented to person, place, and time.     Cranial Nerves: No dysarthria.  Psychiatric:        Attention and Perception: Attention normal.        Mood and Affect: Mood normal. Mood is not anxious or depressed.        Speech: Speech normal.        Behavior: Behavior is cooperative.        Thought Content: Thought content normal. Thought content is not paranoid or delusional. Thought content does not include homicidal or suicidal ideation. Thought content does not include homicidal or suicidal plan.        Cognition and Memory: Cognition and memory normal.        Judgment: Judgment normal.     Lab Review:     Component Value Date/Time   NA 138 02/16/2018 1605   K 3.9 02/16/2018 1605   CL 100 02/16/2018 1605   CO2 31 02/16/2018 1605   GLUCOSE 95 02/16/2018 1605   BUN 15 02/16/2018 1605   CREATININE 0.97 02/16/2018 1605   CALCIUM 10.0 02/16/2018 1605   PROT 6.9 02/16/2018 1605   ALBUMIN 4.3 02/16/2018 1605   AST 14 02/16/2018 1605   ALT 28 02/16/2018 1605   ALKPHOS 119 (H) 02/16/2018 1605   BILITOT 0.3 02/16/2018 1605   GFRNONAA 77.98 03/23/2009 1454       Component Value Date/Time   WBC 12.2 (H) 12/01/2019 1438   WBC 13.0 (H) 11/12/2012 1435   RBC 4.69 12/01/2019 1438   HGB 14.4 12/01/2019 1438   HGB 14.1 03/24/2018 1009   HCT 42.8 12/01/2019 1438   HCT 40.0 03/24/2018 1009   PLT 413 (H) 12/01/2019 1438   MCV 91.3 12/01/2019 1438   MCV 90 03/24/2018 1009   MCH 30.7 12/01/2019 1438   MCHC 33.6 12/01/2019 1438    RDW 12.8 12/01/2019 1438   RDW 12.8 03/24/2018 1009   LYMPHSABS 3.7 12/01/2019 1438   LYMPHSABS 3.8 (H) 03/24/2018 1009   MONOABS 1.0 12/01/2019 1438   EOSABS 0.6 (H) 12/01/2019 1438   EOSABS 0.6 (H) 03/24/2018 1009   BASOSABS 0.1 12/01/2019 1438   BASOSABS 0.1 03/24/2018 1009  No results found for: POCLITH, LITHIUM   No results found for: PHENYTOIN, PHENOBARB, VALPROATE, CBMZ   .res Assessment: Plan:    Bipolar I disorder ([place]) - Plan: ARIPiprazole (ABILIFY) 10 MG tablet, lamoTRIgine (LAMICTAL) 200 MG tablet  Anxiety state - Plan: ARIPiprazole (ABILIFY) 10 MG tablet  History of anxiety  Nazanin is been stable over the last year with the exception of an attempt to reduce Abilify from 7.5 to 5 mg.  She became manic shortly thereafter but recognized it and we increased the Abilify back back to 7.5 mg daily.  Now on Abilify 10 mg per her request.  We will also continue the lamotrigine 200 mg daily for bipolar disorder.  No med changes today. On these for 6 years.  Discussed potential metabolic side effects associated with atypical antipsychotics, as well as potential risk for movement side effects. Advised pt to contact office if movement side effects occur. No AIM.    Disc withdrawal risks etc.  Her driving anxiety is manageable  Follow-up 1 year  Mary Comber, MD, DFAPA   Please see After Visit Summary for patient specific instructions.  No future appointments.  No orders of the defined types were placed in this encounter.     -------------------------------

## 2020-11-27 ENCOUNTER — Other Ambulatory Visit: Payer: Self-pay

## 2020-11-27 ENCOUNTER — Ambulatory Visit (INDEPENDENT_AMBULATORY_CARE_PROVIDER_SITE_OTHER): Payer: BC Managed Care – PPO | Admitting: Otolaryngology

## 2020-11-27 DIAGNOSIS — H903 Sensorineural hearing loss, bilateral: Secondary | ICD-10-CM | POA: Diagnosis not present

## 2020-11-27 DIAGNOSIS — H6123 Impacted cerumen, bilateral: Secondary | ICD-10-CM | POA: Diagnosis not present

## 2020-11-27 NOTE — Progress Notes (Signed)
HPI: Mary Knox is a 40 y.o. female who presents for evaluation of wax buildup in her ears.  She has noticed a little bit more difficulty hearing especially with background noise.  She has had a history of wax buildup in the past..  Past Medical History:  Diagnosis Date   Anxiety    Asthma    Asthma    B12 deficiency    Back pain    Bipolar 1 disorder (HCC)    Bipolar disorder (HCC)    Constipation    Depression    Depression    Fatty liver    Hashimoto's disease    Lactose intolerance    Overweight    PCOS (polycystic ovarian syndrome)    Past Surgical History:  Procedure Laterality Date   WISDOM TOOTH EXTRACTION     Social History   Socioeconomic History   Marital status: Single    Spouse name: Not on file   Number of children: Not on file   Years of education: Not on file   Highest education level: Not on file  Occupational History   Occupation: Higher education careers adviser  Tobacco Use   Smoking status: Never   Smokeless tobacco: Never  Substance and Sexual Activity   Alcohol use: Yes    Comment:  rarely   Drug use: No   Sexual activity: Not on file  Other Topics Concern   Not on file  Social History Narrative   Not on file   Social Determinants of Health   Financial Resource Strain: Not on file  Food Insecurity: Not on file  Transportation Needs: Not on file  Physical Activity: Not on file  Stress: Not on file  Social Connections: Not on file   Family History  Problem Relation Age of Onset   Alcohol abuse Father    Bipolar disorder Father        also OCD   Obesity Father    Sleep apnea Father    Heart disease Father    High blood pressure Father    Diabetes Paternal Grandfather    Stroke Paternal Grandfather    Hypertension Other    Parkinsonism Other    Hypothyroidism Mother    Obesity Mother    No Known Allergies Prior to Admission medications   Medication Sig Start Date End Date Taking? Authorizing Provider  albuterol (PROVENTIL HFA;VENTOLIN HFA)  108 (90 Base) MCG/ACT inhaler Inhale 2 puffs into the lungs as needed for wheezing or shortness of breath. 03/27/18   Roma Schanz R, DO  ARIPiprazole (ABILIFY) 10 MG tablet Take 1 tablet (10 mg total) by mouth daily. 08/16/20   Cottle, Billey Co., MD  Cholecalciferol (VITAMIN D3) 5000 units CAPS Take 1 capsule by mouth daily.    [provider]  ibuprofen (ADVIL) 200 MG tablet Take 400 mg by mouth every 8 (eight) hours as needed for moderate pain. 05/11/19   Colon Branch, MD  lamoTRIgine (LAMICTAL) 200 MG tablet Take 1 tablet (200 mg total) by mouth daily. 08/16/20   Cottle, Billey Co., MD  levothyroxine (SYNTHROID, LEVOTHROID) 50 MCG tablet Take by mouth. 10/20/17   [provider]  metFORMIN (GLUCOPHAGE) 500 MG tablet Take 1 tablet (500 mg total) by mouth daily with breakfast. 04/07/18   Jearld Lesch A, DO     Positive ROS: Otherwise negative  All other systems have been reviewed and were otherwise negative with the exception of those mentioned in the HPI and as above.  Physical  Exam: Constitutional: Alert, well-appearing, no acute distress Ears: External ears without lesions or tenderness. Ear canals with moderate wax buildup that is nonobstructing.  This was cleaned with curettes.  TMs were otherwise clear.  On hearing screening with the 1024 tuning fork she had minimal hearing loss in both ears which was symmetric.Marland Kitchen Nasal: External nose without lesions. Clear nasal passages Oral: Oropharynx clear. Neck: No palpable adenopathy or masses Respiratory: Breathing comfortably  Skin: No facial/neck lesions or rash noted.  Cerumen impaction removal  Date/Time: 11/27/2020 5:04 PM Performed by: Rozetta Nunnery, MD Authorized by: Rozetta Nunnery, MD   Consent:    Consent obtained:  Verbal   Consent given by:  Patient   Risks discussed:  Pain and bleeding Procedure details:    Location:  L ear and R ear   Procedure type: curette   Post-procedure details:     Inspection:  TM intact and canal normal   Hearing quality:  Improved   Procedure completion:  Tolerated well, no immediate complications Comments:     Patient with nonobstructing wax that was cleaned with curettes.  TMs were otherwise clear.  Assessment: Wax buildup that was cleaned in the office today. 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  Plan: Ear canals were cleaned in the office today.  I discussed with her that she probably has some underlying upper frequency sensorineural hearing loss in both ears and she might benefit by obtaining audiologic testing to evaluate this and to see if hearing aids would be an option. She will follow-up here as needed.  Radene Journey, MD

## 2021-01-08 ENCOUNTER — Emergency Department (HOSPITAL_COMMUNITY): Payer: BC Managed Care – PPO

## 2021-01-08 ENCOUNTER — Encounter (HOSPITAL_COMMUNITY): Payer: Self-pay | Admitting: *Deleted

## 2021-01-08 ENCOUNTER — Other Ambulatory Visit: Payer: Self-pay

## 2021-01-08 ENCOUNTER — Emergency Department (HOSPITAL_COMMUNITY)
Admission: EM | Admit: 2021-01-08 | Discharge: 2021-01-08 | Disposition: A | Discharge status: Home / Self Care | Payer: BC Managed Care – PPO | Attending: Emergency Medicine | Admitting: Emergency Medicine

## 2021-01-08 DIAGNOSIS — R059 Cough, unspecified: Secondary | ICD-10-CM | POA: Insufficient documentation

## 2021-01-08 DIAGNOSIS — Z79899 Other long term (current) drug therapy: Secondary | ICD-10-CM | POA: Insufficient documentation

## 2021-01-08 DIAGNOSIS — R0602 Shortness of breath: Secondary | ICD-10-CM | POA: Insufficient documentation

## 2021-01-08 DIAGNOSIS — E039 Hypothyroidism, unspecified: Secondary | ICD-10-CM | POA: Insufficient documentation

## 2021-01-08 DIAGNOSIS — J45909 Unspecified asthma, uncomplicated: Secondary | ICD-10-CM | POA: Diagnosis not present

## 2021-01-08 MED ORDER — PREDNISONE 20 MG PO TABS: ORAL_TABLET | ORAL | 0 refills | Status: DC

## 2021-01-08 MED ORDER — ALBUTEROL SULFATE HFA 108 (90 BASE) MCG/ACT IN AERS: 1.0000 | INHALATION_SPRAY | Freq: Four times a day (QID) | RESPIRATORY_TRACT | 0 refills | Status: AC | PRN

## 2021-01-08 MED ORDER — BENZONATATE 100 MG PO CAPS: 100.0000 mg | ORAL_CAPSULE | Freq: Three times a day (TID) | ORAL | 0 refills | Status: AC

## 2021-01-08 NOTE — ED Provider Notes (Signed)
[facility] EMERGENCY DEPARTMENT Provider Note   CSN: [id number] Arrival date & time: 01/08/21  0040     History Chief Complaint  Patient presents with   Shortness of Breath    Mary Knox is a 40 y.o. female.  The history is provided by the patient and medical records.  Shortness of Breath Associated symptoms: cough    40 year old female with history of asthma, bipolar disorder, depression, presenting to the ED with very mild shortness of breath.  States she was diagnosed COVID-positive 01/05/2021.  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  States initially her cough was dry but is since become productive with some yellow mucus.  No hemoptysis.  States tonight was only time she really felt short of breath.  She used her rescue inhaler with some relief but does not feel completely back to baseline.  She denies any chest pain or pain with deep breathing.  She has not had any fevers.  She is eating and drinking well.  Past Medical History:  Diagnosis Date   Anxiety    Asthma    Asthma    B12 deficiency    Back pain    Bipolar 1 disorder (HCC)    Bipolar disorder ([place])    Constipation    Depression    Depression    Fatty liver    Hashimoto's disease    Lactose intolerance    Overweight    PCOS (polycystic ovarian syndrome)     Patient Active Problem List   Diagnosis Date Noted   Oral thrush 07/30/2018   Pharyngitis 07/30/2018   At risk for diabetes mellitus 04/13/2018   Insulin resistance 04/13/2018   Class 3 severe obesity with serious comorbidity and body mass index (BMI) of 40.0 to 44.9 in adult ([place]) 03/30/2018   Elevated liver enzymes 02/16/2018   Hypothyroidism 02/07/2018   Acute viral conjunctivitis of both eyes 08/23/2017   Acute serous otitis media of left ear 08/05/2015   Asthmatic bronchitis 09/21/2013   Obesity (BMI 30-39.9) 02/22/2013   Insect bite of leg, left, infected 02/22/2013    Plantar fasciitis of left foot 02/22/2013   Polycystic ovaries 11/12/2012   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[place]) 123456   Metabolic syndrome X 123456   Bipolar disorder, unspecified ([place]) 11/12/2012   LYMPHOCYTOSIS 03/29/2009   TRANSAMINASES, SERUM, ELEVATED 03/29/2009   HYPERTRIGLYCERIDEMIA 03/23/2009   ANEMIA, B12 DEFICIENCY 03/23/2009   Asthma 03/23/2009    Past Surgical History:  Procedure Laterality Date   WISDOM TOOTH EXTRACTION       OB History     Gravida  1   Para  1   Term      Preterm      AB      Living         SAB      IAB      Ectopic      Multiple      Live Births              Family History  Problem Relation Age of Onset   Alcohol abuse Father    Bipolar disorder Father        also OCD   Obesity Father    Sleep apnea Father    Heart disease Father    High blood pressure Father    Diabetes Paternal Grandfather    Stroke Paternal Grandfather    Hypertension Other  Parkinsonism Other    Hypothyroidism Mother    Obesity Mother     Social History   Tobacco Use   Smoking status: Never   Smokeless tobacco: Never  Substance Use Topics   Alcohol use: Yes    Comment:  rarely   Drug use: No    Home Medications Prior to Admission medications   Medication Sig Start Date End Date Taking? Authorizing Provider  albuterol (VENTOLIN HFA) 108 (90 Base) MCG/ACT inhaler Inhale 1-2 puffs into the lungs every 6 (six) hours as needed for wheezing. 01/08/21  Yes Larene Pickett, PA-C  benzonatate (TESSALON) 100 MG capsule Take 1 capsule (100 mg total) by mouth every 8 (eight) hours. 01/08/21  Yes Larene Pickett, PA-C  predniSONE (DELTASONE) 20 MG tablet Take 40 mg by mouth daily for 3 days, then '20mg'$  by mouth daily for 3 days, then '10mg'$  daily for 3 days 01/08/21  Yes Larene Pickett, PA-C  ARIPiprazole (ABILIFY) 10 MG tablet Take 1 tablet (10 mg total) by mouth daily. 08/16/20   Cottle, Billey Co., MD  Cholecalciferol  (VITAMIN D3) 5000 units CAPS Take 1 capsule by mouth daily.    [provider]  ibuprofen (ADVIL) 200 MG tablet Take 400 mg by mouth every 8 (eight) hours as needed for moderate pain. 05/11/19   Colon Branch, MD  lamoTRIgine (LAMICTAL) 200 MG tablet Take 1 tablet (200 mg total) by mouth daily. 08/16/20   Cottle, Billey Co., MD  levothyroxine (SYNTHROID, LEVOTHROID) 50 MCG tablet Take by mouth. 10/20/17   [provider]  metFORMIN (GLUCOPHAGE) 500 MG tablet Take 1 tablet (500 mg total) by mouth daily with breakfast. 04/07/18   Jearld Lesch A, DO    Allergies    Patient has no known allergies.  Review of Systems   Review of Systems  Respiratory:  Positive for cough and shortness of breath.   All other systems reviewed and are negative.  Physical Exam Updated Vital Signs BP 117/84   Pulse 73   Temp 97.8 F (36.6 C)   Resp 16   LMP 12/25/2020   SpO2 98%   Physical Exam Vitals and nursing note reviewed.  Constitutional:      Appearance: She is well-developed.  HENT:     Head: Normocephalic and atraumatic.  Eyes:     General: No scleral icterus.       Right eye: No discharge.     Conjunctiva/sclera: Conjunctivae normal.     Pupils: Pupils are equal, round, and reactive to light.  Cardiovascular:     Rate and Rhythm: Normal rate and regular rhythm.     Heart sounds: Normal heart sounds.  Pulmonary:     Effort: Pulmonary effort is normal.     Breath sounds: Normal breath sounds. No wheezing or rhonchi.     Comments: Dry cough observed, NAD, speaking in full sentences without difficulty, O2 sats 99% during fluid conversation Abdominal:     General: Bowel sounds are normal.     Palpations: Abdomen is soft.  Musculoskeletal:        General: Normal range of motion.     Cervical back: Normal range of motion.  Skin:    General: Skin is warm and dry.  Neurological:     Mental Status: She is alert and oriented to person, place, and time.    ED Results / Procedures  / Treatments   Labs (all labs ordered are listed, but only abnormal results are displayed)  Labs Reviewed - No data to display  EKG None  Radiology DG Chest [facility] 1 View  Result Date: 01/08/2021 CLINICAL DATA:  Cough, recent COVID. EXAM: PORTABLE CHEST 1 VIEW COMPARISON:  05/16/2015 FINDINGS: Heart and mediastinal contours are within normal limits. No focal opacities or effusions. No acute bony abnormality. IMPRESSION: No active disease. Electronically Signed   By: Rolm Baptise M.D.   On: 01/08/2021 02:18    Procedures Procedures   Medications Ordered in ED Medications - No data to display  ED Course  I have reviewed the triage vital signs and the nursing notes.  Pertinent labs & imaging results that were available during my care of the patient were reviewed by me and considered in my medical decision making (see chart for details).    MDM Rules/Calculators/A&P                           40 year old female presenting to the ED with shortness of breath.  Has underlying asthma and recently been diagnosed with COVID-19.  She is afebrile and nontoxic in appearance here.  Her lungs are overall clear and she is not displaying any signs of respiratory distress.  Her vitals are stable on room air.  Chest x-ray was obtained, no evidence of focal infiltrate at.  She does not have any chest pain, tachycardia, or hypoxia to suggest PE and my suspicion for such is low.  Suspect her COVID may have exacerbated her underlying asthma.  We will refill her rescue inhaler, start short course of steroids and Tessalon for cough.  She is given a work note.  Encouraged to follow-up with primary care.  Return here for new concerns.  Final Clinical Impression(s) / ED Diagnoses Final diagnoses:  Shortness of breath    Rx / DC Orders ED Discharge Orders          Ordered    albuterol (VENTOLIN HFA) 108 (90 Base) MCG/ACT inhaler  Every 6 hours PRN        01/08/21 0244    benzonatate (TESSALON) 100 MG capsule   Every 8 hours        01/08/21 0244    predniSONE (DELTASONE) 20 MG tablet        01/08/21 0244             Larene Pickett, PA-C 01/08/21 0301    Palumbo, April, MD 01/08/21 [id number]

## 2021-01-08 NOTE — Discharge Instructions (Addendum)
Take the prescribed medication as directed.  May want to start steroids in the morning after breakfast as this can tend to keep you up if taken at night time. Follow-up with your primary care doctor. Return to the ED for new or worsening symptoms-- coughing up blood, worsening shortness of breath, etc.

## 2021-01-08 NOTE — ED Triage Notes (Signed)
Pt has hx of asthma, has tried her rescue inhaler several times without significant relief. Reports difficulty sleeping due to sob

## 2021-04-13 IMAGING — DX DG WRIST COMPLETE 3+V*R*
4 series · 4 of 4 positions shown · non-contrast
Comparison: Concurrent right hand radiographs

CLINICAL DATA: Injury to the right wrist and hand 4 days ago with
pain and swelling

EXAM:
RIGHT WRIST - COMPLETE 3+ VIEW

[wrist pa]
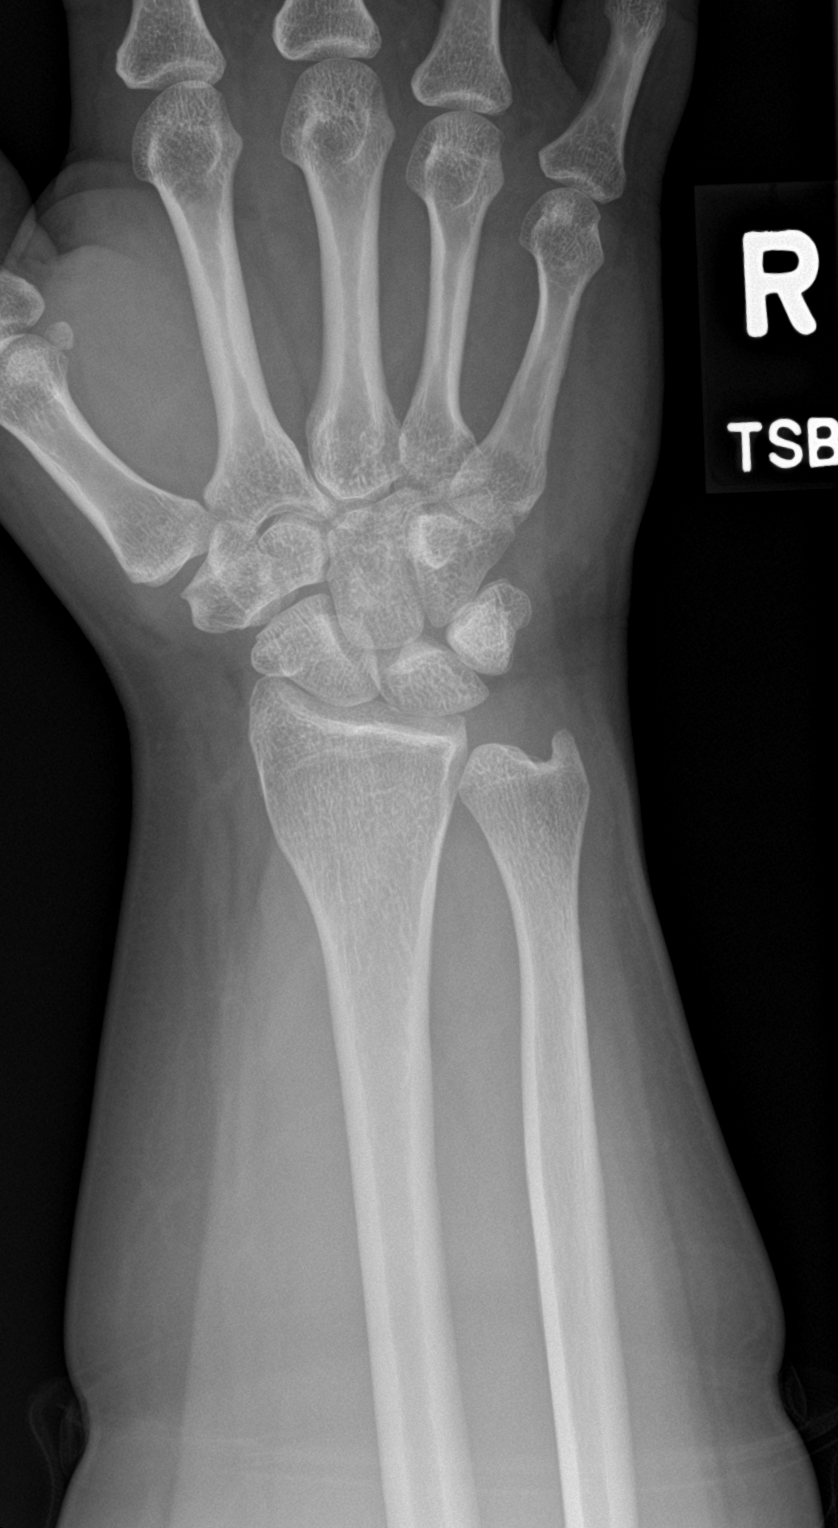

[wrist obl]
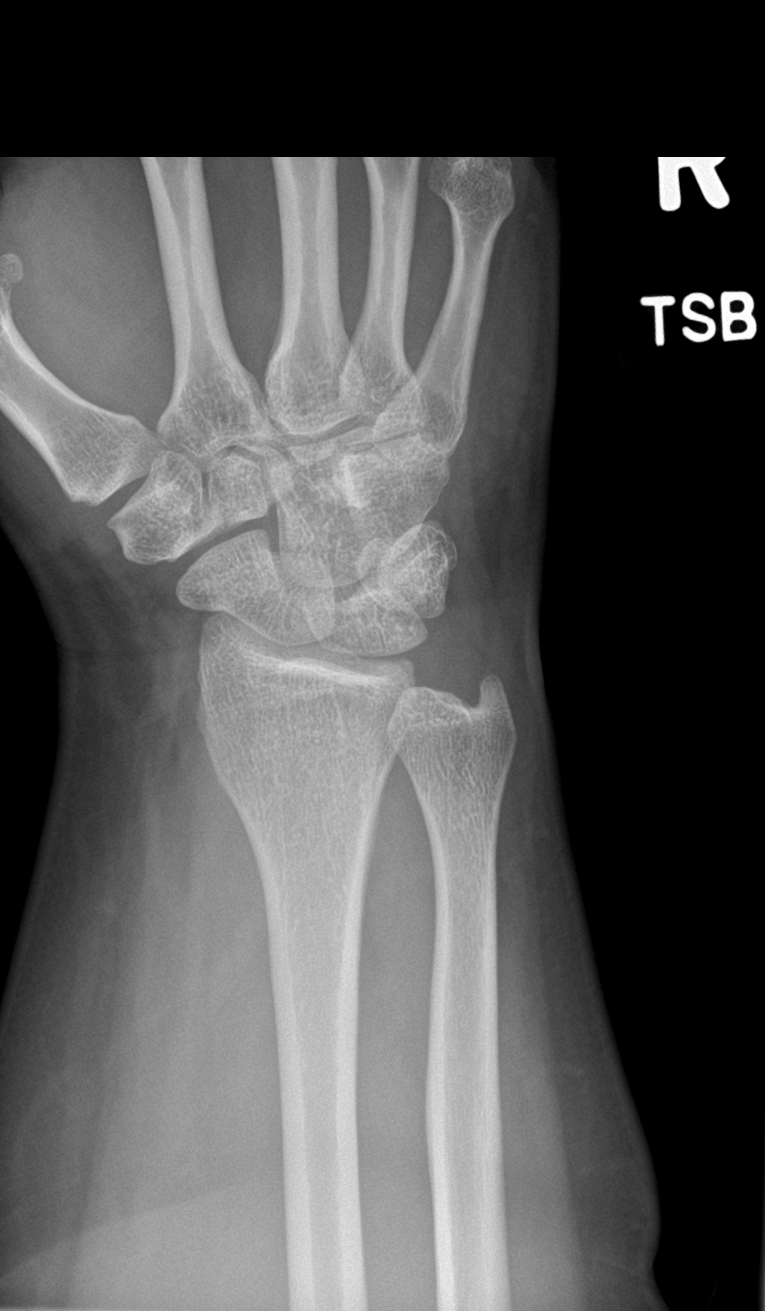

[wrist lat]
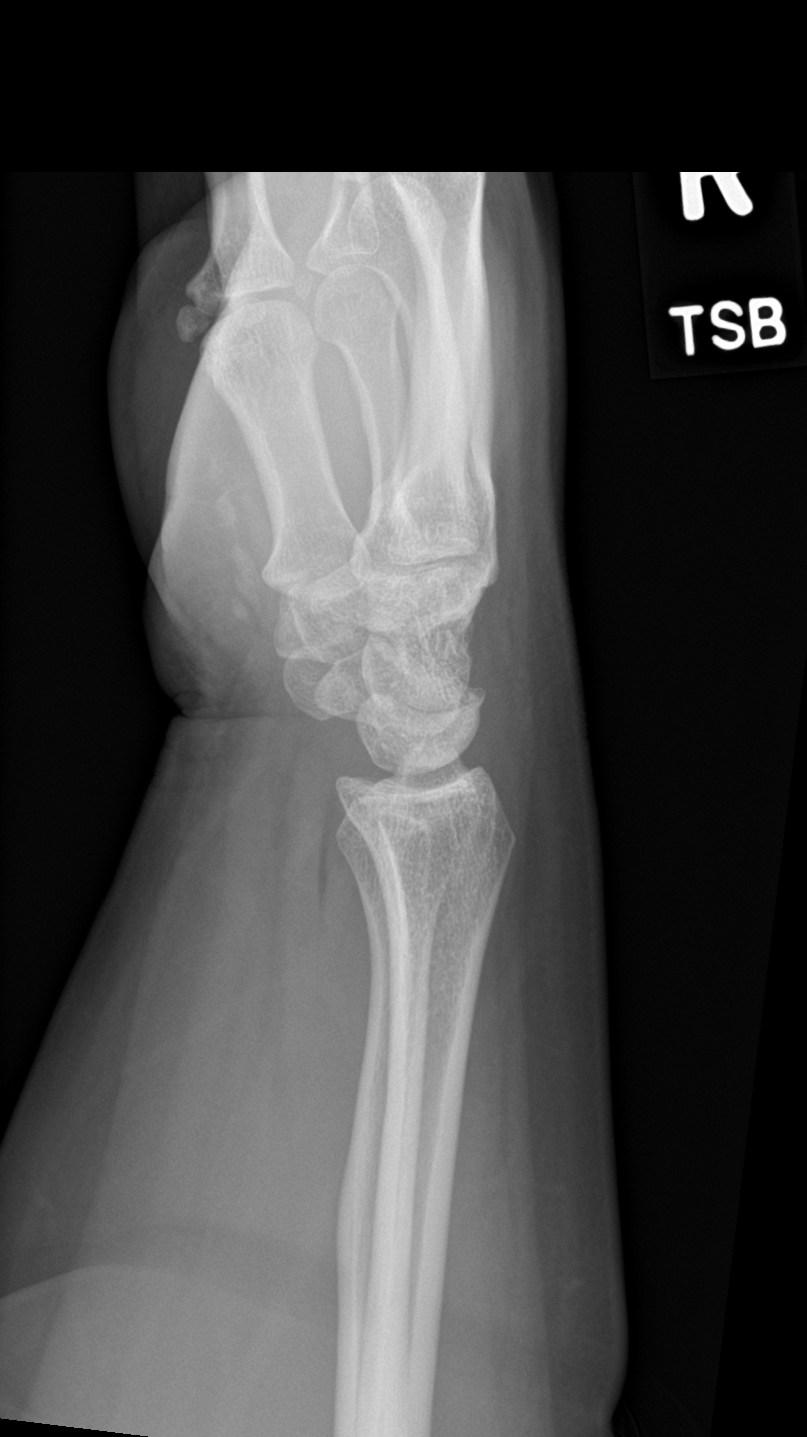

[wrist navicular]
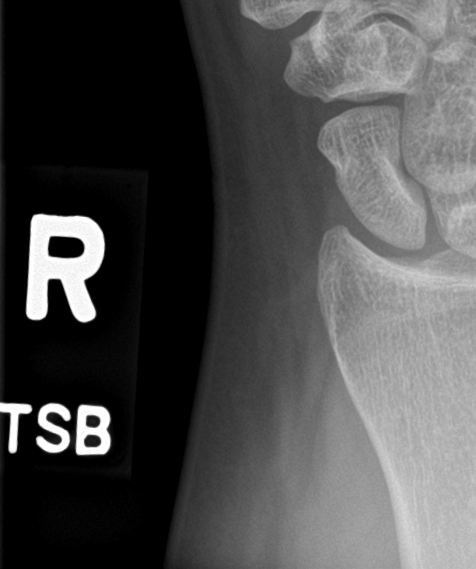

[4 of 4 positions shown; findings below may reference images not displayed]

FINDINGS: There is no evidence of fracture or dislocation. Minimal first
carpometacarpal arthrosis. No other significant arthropathy or other
focal bone abnormality. Increased attenuation along the palmar soft
tissues, nonspecific and potentially projectional as this is not
clearly apparent on concurrent hand radiographs. Soft tissues are
otherwise unremarkable.
IMPRESSION: 1. No acute fracture or dislocation.
2. Minimal first carpometacarpal arthrosis.
3. Increased attenuation along the palmar soft tissues, nonspecific
and potentially projectional. Correlate with visual inspection.

## 2021-08-16 ENCOUNTER — Ambulatory Visit: Payer: BC Managed Care – PPO | Admitting: Psychiatry

## 2021-08-16 ENCOUNTER — Encounter: Payer: Self-pay | Admitting: Psychiatry

## 2021-08-16 DIAGNOSIS — R202 Paresthesia of skin: Secondary | ICD-10-CM

## 2021-08-16 DIAGNOSIS — F319 Bipolar disorder, unspecified: Secondary | ICD-10-CM

## 2021-08-16 DIAGNOSIS — E038 Other specified hypothyroidism: Secondary | ICD-10-CM | POA: Diagnosis not present

## 2021-08-16 DIAGNOSIS — E063 Autoimmune thyroiditis: Secondary | ICD-10-CM

## 2021-08-16 DIAGNOSIS — F411 Generalized anxiety disorder: Secondary | ICD-10-CM

## 2021-08-16 DIAGNOSIS — R5383 Other fatigue: Secondary | ICD-10-CM | POA: Diagnosis not present

## 2021-08-16 MED ORDER — ARIPIPRAZOLE 10 MG PO TABS: 10.0000 mg | ORAL_TABLET | Freq: Every day | ORAL | 3 refills | Status: DC

## 2021-08-16 MED ORDER — LAMOTRIGINE 200 MG PO TABS: 200.0000 mg | ORAL_TABLET | Freq: Every day | ORAL | 3 refills | Status: DC

## 2021-08-16 NOTE — Progress Notes (Signed)
Mary Knox ?[id number] ?01-03-1981 ?41 y.o. ? ?Subjective:  ? ?Patient ID:  Mary Knox is a 41 y.o. (DOB 1980/12/17) female. ? ?Chief Complaint:  ?Chief Complaint  ?Patient presents with  ? Follow-up  ? ? ?HPI ?Mary Knox presents to the office today for follow-up of bipolarand anxiety.   ? ?visit April 2020.  She was doing well no meds were changed. ? ?April 2021 visit with the following noted: ?Asked to increase Abilify to 10 and mood more stable and less depression.  No sign panic or anxiiety. ? ?08/16/2020 appointment with the following noted: ?Fine and normal. ?No Covid. ?Ran out of meds for 2 days and feels a little off.   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   ?D going to college.   ?Stayed on Abilify 10 mg daily and lamotrigine 200 mg daily. ?Plan: Now on Abilify 10 mg per her request.  We will also continue the lamotrigine 200 mg daily for bipolar disorder. ?No med changes today. On these for 6 years. ? ?08/16/2021 appointment with the following noted: ?Mood has been ok and stable. ?Stress with D at [facility] and won't talk to pt.   ?Consistent with meds.  No SE and no TD sx. ? ?Patient reports stable mood and denies depressed or irritable moods. No manic sx. Patient denies any recent difficulty with anxiety.  Patient denies difficulty with sleep initiation or maintenance. Denies appetite disturbance.  Patient reports that energy is low but motivation have been good.  Patient denies any difficulty with concentration.  Patient denies any suicidal ideation. ? ?PCP Freda Munro. ? ?Past Psychiatric Medication Trials: ?Abilify 5-10 ?Lamotrigine 200 ? ?D sees TH  and won't talk to pt ? ?Review of Systems:  ?Review of Systems  ?Cardiovascular:  Negative for palpitations.  ?Musculoskeletal:  Positive for arthralgias.  ?Neurological:  Negative for tremors and weakness.  ? ?Medications: I have reviewed the patient's current medications. ? ?Current Outpatient Medications  ?Medication Sig  Dispense Refill  ? albuterol (VENTOLIN HFA) 108 (90 Base) MCG/ACT inhaler Inhale 1-2 puffs into the lungs every 6 (six) hours as needed for wheezing. 1 each 0  ? benzonatate (TESSALON) 100 MG capsule Take 1 capsule (100 mg total) by mouth every 8 (eight) hours. 21 capsule 0  ? Cholecalciferol (VITAMIN D3) 5000 units CAPS Take 1 capsule by mouth daily.    ? ibuprofen (ADVIL) 200 MG tablet Take 400 mg by mouth every 8 (eight) hours as needed for moderate pain.    ? levothyroxine (SYNTHROID, LEVOTHROID) 50 MCG tablet Take by mouth.    ? metFORMIN (GLUCOPHAGE) 500 MG tablet Take 1 tablet (500 mg total) by mouth daily with breakfast. 30 tablet 0  ? ARIPiprazole (ABILIFY) 10 MG tablet Take 1 tablet (10 mg total) by mouth daily. 90 tablet 3  ? lamoTRIgine (LAMICTAL) 200 MG tablet Take 1 tablet (200 mg total) by mouth daily. 90 tablet 3  ? ?No current facility-administered medications for this visit.  ? ? ?Medication Side Effects: None, sun sensitivity ? ?Allergies: No Known Allergies ? ?Past Medical History:  ?Diagnosis Date  ? Anxiety   ? Asthma   ? Asthma   ? B12 deficiency   ? Back pain   ? Bipolar 1 disorder ([place])   ? Bipolar disorder ([place])   ? Constipation   ? Depression   ? Depression   ? Fatty liver   ? Hashimoto's disease   ? Lactose intolerance   ?  Overweight   ? PCOS (polycystic ovarian syndrome)   ? ? ?Family History  ?Problem Relation Age of Onset  ? Alcohol abuse Father   ? Bipolar disorder Father   ?     also OCD  ? Obesity Father   ? Sleep apnea Father   ? Heart disease Father   ? High blood pressure Father   ? Diabetes Paternal Grandfather   ? Stroke Paternal Grandfather   ? Hypertension Other   ? Parkinsonism Other   ? Hypothyroidism Mother   ? Obesity Mother   ? ? ?Social History  ? ?Socioeconomic History  ? Marital status: Single  ?  Spouse name: Not on file  ? Number of children: Not on file  ? Years of education: Not on file  ? Highest education level: Not on file  ?Occupational History  ? Occupation:  Higher education careers adviser  ?Tobacco Use  ? Smoking status: Never  ? Smokeless tobacco: Never  ?Substance and Sexual Activity  ? Alcohol use: Yes  ?  Comment:  rarely  ? Drug use: No  ? Sexual activity: Not on file  ?Other Topics Concern  ? Not on file  ?Social History Narrative  ? Not on file  ? ?Social Determinants of Health  ? ?Financial Resource Strain: Not on file  ?Food Insecurity: Not on file  ?Transportation Needs: Not on file  ?Physical Activity: Not on file  ?Stress: Not on file  ?Social Connections: Not on file  ?Intimate Partner Violence: Not on file  ? ? ?Past Medical History, Surgical history, Social history, and Family history were reviewed and updated as appropriate.  ? ?Please see review of systems for further details on the patient's review from today.  ? ?Objective:  ? ?Physical Exam:  ?There were no vitals taken for this visit. ? ?Physical Exam ?Constitutional:   ?   Appearance: She is obese.  ?Neurological:  ?   Mental Status: She is alert and oriented to person, place, and time.  ?   Cranial Nerves: No dysarthria.  ?Psychiatric:     ?   Attention and Perception: Attention normal.     ?   Mood and Affect: Mood normal. Mood is not anxious or depressed.     ?   Speech: Speech normal.     ?   Behavior: Behavior is cooperative.     ?   Thought Content: Thought content normal. Thought content is not paranoid or delusional. Thought content does not include homicidal or suicidal ideation. Thought content does not include suicidal plan.     ?   Cognition and Memory: Cognition and memory normal.     ?   Judgment: Judgment normal.  ? ? ?Lab Review:  ?   ?Component Value Date/Time  ? NA 138 02/16/2018 1605  ? K 3.9 02/16/2018 1605  ? CL 100 02/16/2018 1605  ? CO2 31 02/16/2018 1605  ? GLUCOSE 95 02/16/2018 1605  ? BUN 15 02/16/2018 1605  ? CREATININE 0.97 02/16/2018 1605  ? CALCIUM 10.0 02/16/2018 1605  ? PROT 6.9 02/16/2018 1605  ? ALBUMIN 4.3 02/16/2018 1605  ? AST 14 02/16/2018 1605  ? ALT 28 02/16/2018 1605  ?  ALKPHOS 119 (H) 02/16/2018 1605  ? BILITOT 0.3 02/16/2018 1605  ? [facility] 77.98 03/23/2009 1454  ? ? ?   ?Component Value Date/Time  ? WBC 12.2 (H) 12/01/2019 1438  ? WBC 13.0 (H) 11/12/2012 1435  ? RBC 4.69 12/01/2019 1438  ? HGB 14.4 12/01/2019  1438  ? HGB 14.1 03/24/2018 1009  ? HCT 42.8 12/01/2019 1438  ? HCT 40.0 03/24/2018 1009  ? PLT 413 (H) 12/01/2019 1438  ? MCV 91.3 12/01/2019 1438  ? MCV 90 03/24/2018 1009  ? MCH 30.7 12/01/2019 1438  ? MCHC 33.6 12/01/2019 1438  ? RDW 12.8 12/01/2019 1438  ? RDW 12.8 03/24/2018 1009  ? LYMPHSABS 3.7 12/01/2019 1438  ? LYMPHSABS 3.8 (H) 03/24/2018 1009  ? MONOABS 1.0 12/01/2019 1438  ? EOSABS 0.6 (H) 12/01/2019 1438  ? EOSABS 0.6 (H) 03/24/2018 1009  ? BASOSABS 0.1 12/01/2019 1438  ? BASOSABS 0.1 03/24/2018 1009  ? ? ?No results found for: POCLITH, LITHIUM  ? ?No results found for: PHENYTOIN, PHENOBARB, VALPROATE, CBMZ  ? ?.res ?Assessment: Plan:   ? ?Bipolar I disorder ([place]) - Plan: TSH, Vitamin B12, lamoTRIgine (LAMICTAL) 200 MG tablet, ARIPiprazole (ABILIFY) 10 MG tablet ? ?Hypothyroidism due to Hashimoto's thyroiditis - Plan: TSH ? ?Fatigue, unspecified type ? ?Paresthesia of left leg ? ?Anxiety state - Plan: ARIPiprazole (ABILIFY) 10 MG tablet  ?History of anxiety ? ?Keishawna is been stable over the last year with the exception of an attempt to reduce Abilify from 7.5 to 5 mg.  She became manic shortly thereafter but recognized it and we increased the Abilify back back to 7.5 mg daily.  Now on Abilify 10 mg per her request.  We will also continue the lamotrigine 200 mg daily for bipolar disorder. ? ?No med changes today. On these for 6 years. ? ?Discussed potential metabolic side effects associated with atypical antipsychotics, as well as potential risk for movement side effects. Advised pt to contact office if movement side effects occur. No AIM.   ? ?Answered questions about vitamins. ? ?Check B12 DT low energy and paresthesia, and chck TSH bc not checked in awhile  and endo is not responsive lately. ? ?Disc withdrawal risks etc. ? ?Her driving anxiety is manageable ? ?Follow-up 1 year ? ?Hiram Comber, MD, DFAPA ? ? ?Please see After Visit Summary for patient specific instruc

## 2021-08-21 ENCOUNTER — Other Ambulatory Visit: Payer: Self-pay | Admitting: Psychiatry

## 2021-08-21 DIAGNOSIS — F411 Generalized anxiety disorder: Secondary | ICD-10-CM

## 2021-08-21 DIAGNOSIS — F319 Bipolar disorder, unspecified: Secondary | ICD-10-CM

## 2021-10-29 ENCOUNTER — Other Ambulatory Visit: Payer: Self-pay | Admitting: Family Medicine

## 2021-10-29 DIAGNOSIS — E049 Nontoxic goiter, unspecified: Secondary | ICD-10-CM

## 2021-10-30 ENCOUNTER — Ambulatory Visit
Admission: RE | Admit: 2021-10-30 | Discharge: 2021-10-30 | Disposition: A | Discharge status: Home / Self Care | Payer: BC Managed Care – PPO | Source: Ambulatory Visit | Attending: Family Medicine | Admitting: Family Medicine

## 2021-10-30 DIAGNOSIS — E049 Nontoxic goiter, unspecified: Secondary | ICD-10-CM

## 2022-08-20 ENCOUNTER — Encounter: Payer: Self-pay | Admitting: Psychiatry

## 2022-08-20 ENCOUNTER — Ambulatory Visit: Payer: BC Managed Care – PPO | Admitting: Psychiatry

## 2022-08-20 DIAGNOSIS — F319 Bipolar disorder, unspecified: Secondary | ICD-10-CM

## 2022-08-20 DIAGNOSIS — R5383 Other fatigue: Secondary | ICD-10-CM

## 2022-08-20 DIAGNOSIS — F411 Generalized anxiety disorder: Secondary | ICD-10-CM

## 2022-08-20 MED ORDER — LAMOTRIGINE 200 MG PO TABS: 200.0000 mg | ORAL_TABLET | Freq: Every day | ORAL | 3 refills | Status: DC

## 2022-08-20 MED ORDER — ARIPIPRAZOLE 10 MG PO TABS: 10.0000 mg | ORAL_TABLET | Freq: Every day | ORAL | 3 refills | Status: DC

## 2022-08-20 NOTE — Progress Notes (Signed)
Mary Knox [id number] October 11, 1980 42 y.o.  Subjective:   Patient ID:  Mary Knox is a 42 y.o. (DOB 1980-09-01) female.  Chief Complaint:  Chief Complaint  Patient presents with   Follow-up    HPI Mary Knox presents to the office today for follow-up of bipolarand anxiety.    visit April 2020.  She was doing well no meds were changed.  April 2021 visit with the following noted: Asked to increase Abilify to 10 and mood more stable and less depression.  No sign panic or anxiiety.  08/16/2020 appointment with the following noted: Fine and normal. No Covid. Ran out of meds for 2 days and feels a little off.   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   D going to college.   Stayed on Abilify 10 mg daily and lamotrigine 200 mg daily. Plan: Now on Abilify 10 mg per her request.  We will also continue the lamotrigine 200 mg daily for bipolar disorder. No med changes today. On these for 6 years.  08/16/2021 appointment with the following noted: Mood has been ok and stable. Stress with D at [facility] and won't talk to pt.   Consistent with meds.  No SE and no TD sx. Plan: no changes  08/20/22 appt noted: Consistent with meds.  No SE Added metformin. Doing welll wihtout.  Building a house and nearly done.  No mood swings.  But 3-4 times had very short cycles.  Not severe just couldn't sleep.  Overall very stable.   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 D still doing well but still won't talk to her but does talke with other family members.    Patient reports stable mood and denies depressed or irritable moods. No manic sx. Patient denies any recent difficulty with anxiety.  Patient denies difficulty with sleep initiation or maintenance. Denies appetite disturbance.  Patient reports that energy is low but motivation have been good.  Patient denies any difficulty with concentration.  Patient denies any suicidal ideation.  PCP Peggyann Shoals.  Past  Psychiatric Medication Trials: Abilify 5-10 Lamotrigine 200  D sees TH  and won't talk to pt  Review of Systems:  Review of Systems  Cardiovascular:  Negative for palpitations.  Musculoskeletal:  Positive for arthralgias.  Neurological:  Negative for tremors.    Medications: I have reviewed the patient's current medications.  Current Outpatient Medications  Medication Sig Dispense Refill   albuterol (VENTOLIN HFA) 108 (90 Base) MCG/ACT inhaler Inhale 1-2 puffs into the lungs every 6 (six) hours as needed for wheezing. 1 each 0   ARIPiprazole (ABILIFY) 10 MG tablet Take 1 tablet (10 mg total) by mouth daily. 90 tablet 3   benzonatate (TESSALON) 100 MG capsule Take 1 capsule (100 mg total) by mouth every 8 (eight) hours. 21 capsule 0   Cholecalciferol (VITAMIN D3) 5000 units CAPS Take 1 capsule by mouth daily.     ibuprofen (ADVIL) 200 MG tablet Take 400 mg by mouth every 8 (eight) hours as needed for moderate pain.     lamoTRIgine (LAMICTAL) 200 MG tablet Take 1 tablet (200 mg total) by mouth daily. 90 tablet 3   levothyroxine (SYNTHROID, LEVOTHROID) 50 MCG tablet Take by mouth.     metFORMIN (GLUCOPHAGE) 500 MG tablet Take 1 tablet (500 mg total) by mouth daily with breakfast. 30 tablet 0   No current facility-administered medications for this visit.    Medication Side Effects: None, sun  sensitivity  Allergies: No Known Allergies  Past Medical History:  Diagnosis Date   Anxiety    Asthma    Asthma    B12 deficiency    Back pain    Bipolar 1 disorder    Bipolar disorder    Constipation    Depression    Depression    Fatty liver    Hashimoto's disease    Lactose intolerance    Overweight    PCOS (polycystic ovarian syndrome)     Family History  Problem Relation Age of Onset   Alcohol abuse Father    Bipolar disorder Father        also OCD   Obesity Father    Sleep apnea Father    Heart disease Father    High blood pressure Father    Diabetes Paternal  Grandfather    Stroke Paternal Grandfather    Hypertension Other    Parkinsonism Other    Hypothyroidism Mother    Obesity Mother     Social History   Socioeconomic History   Marital status: Single    Spouse name: Not on file   Number of children: Not on file   Years of education: Not on file   Highest education level: Not on file  Occupational History   Occupation: Building control surveyor  Tobacco Use   Smoking status: Never   Smokeless tobacco: Never  Substance and Sexual Activity   Alcohol use: Yes    Comment:  rarely   Drug use: No   Sexual activity: Not on file  Other Topics Concern   Not on file  Social History Narrative   Not on file   Social Determinants of Health   Financial Resource Strain: Not on file  Food Insecurity: Not on file  Transportation Needs: Not on file  Physical Activity: Not on file  Stress: Not on file  Social Connections: Not on file  Intimate Partner Violence: Not on file    Past Medical History, Surgical history, Social history, and Family history were reviewed and updated as appropriate.   Please see review of systems for further details on the patient's review from today.   Objective:   Physical Exam:  There were no vitals taken for this visit.  Physical Exam Constitutional:      Appearance: She is obese.  Neurological:     Mental Status: She is alert and oriented to person, place, and time.     Cranial Nerves: No dysarthria.  Psychiatric:        Attention and Perception: Attention normal.        Mood and Affect: Mood normal. Mood is not anxious or depressed.        Speech: Speech normal.        Behavior: Behavior is not agitated. Behavior is cooperative.        Thought Content: Thought content normal. Thought content is not paranoid or delusional. Thought content does not include homicidal or suicidal ideation. Thought content does not include suicidal plan.        Cognition and Memory: Cognition and memory normal.        Judgment:  Judgment normal.     Lab Review:     Component Value Date/Time   NA 138 02/16/2018 1605   K 3.9 02/16/2018 1605   CL 100 02/16/2018 1605   CO2 31 02/16/2018 1605   GLUCOSE 95 02/16/2018 1605   BUN 15 02/16/2018 1605   CREATININE 0.97 02/16/2018 1605   CALCIUM  10.0 02/16/2018 1605   PROT 6.9 02/16/2018 1605   ALBUMIN 4.3 02/16/2018 1605   AST 14 02/16/2018 1605   ALT 28 02/16/2018 1605   ALKPHOS 119 (H) 02/16/2018 1605   BILITOT 0.3 02/16/2018 1605   GFRNONAA 77.98 03/23/2009 1454       Component Value Date/Time   WBC 12.2 (H) 12/01/2019 1438   WBC 13.0 (H) 11/12/2012 1435   RBC 4.69 12/01/2019 1438   HGB 14.4 12/01/2019 1438   HGB 14.1 03/24/2018 1009   HCT 42.8 12/01/2019 1438   HCT 40.0 03/24/2018 1009   PLT 413 (H) 12/01/2019 1438   MCV 91.3 12/01/2019 1438   MCV 90 03/24/2018 1009   MCH 30.7 12/01/2019 1438   MCHC 33.6 12/01/2019 1438   RDW 12.8 12/01/2019 1438   RDW 12.8 03/24/2018 1009   LYMPHSABS 3.7 12/01/2019 1438   LYMPHSABS 3.8 (H) 03/24/2018 1009   MONOABS 1.0 12/01/2019 1438   EOSABS 0.6 (H) 12/01/2019 1438   EOSABS 0.6 (H) 03/24/2018 1009   BASOSABS 0.1 12/01/2019 1438   BASOSABS 0.1 03/24/2018 1009    No results found for: "POCLITH", "LITHIUM"   No results found for: "PHENYTOIN", "PHENOBARB", "VALPROATE", "CBMZ"   .res Assessment: Plan:    Bipolar I disorder  Fatigue, unspecified type  History of anxiety  Mary Knox is been stable over the last year with the exception of an attempt to reduce Abilify from 7.5 to 5 mg.  She became manic shortly thereafter but recognized it and we increased the Abilify back back to 7.5 mg daily.  Now on Abilify 10 mg per her request.  We will also continue the lamotrigine 200 mg daily for bipolar disorder.  No med changes today. On these for 7 years.  Discussed potential metabolic side effects associated with atypical antipsychotics, as well as potential risk for movement side effects. Advised pt to contact  office if movement side effects occur. No AIM.    Answered questions about vitamins.  Disc withdrawal risks etc.  Her driving anxiety is manageable  Follow-up 1 year  Iona Hansen, MD, DFAPA   Please see After Visit Summary for patient specific instructions.  No future appointments.  No orders of the defined types were placed in this encounter.     -------------------------------

## 2022-08-27 ENCOUNTER — Other Ambulatory Visit: Payer: Self-pay | Admitting: Psychiatry

## 2022-08-27 DIAGNOSIS — F319 Bipolar disorder, unspecified: Secondary | ICD-10-CM

## 2022-08-27 DIAGNOSIS — F411 Generalized anxiety disorder: Secondary | ICD-10-CM

## 2023-08-20 ENCOUNTER — Ambulatory Visit (INDEPENDENT_AMBULATORY_CARE_PROVIDER_SITE_OTHER): Payer: BC Managed Care – PPO | Admitting: Psychiatry

## 2023-08-20 ENCOUNTER — Encounter: Payer: Self-pay | Admitting: Psychiatry

## 2023-08-20 DIAGNOSIS — F411 Generalized anxiety disorder: Secondary | ICD-10-CM

## 2023-08-20 DIAGNOSIS — F319 Bipolar disorder, unspecified: Secondary | ICD-10-CM

## 2023-08-20 DIAGNOSIS — R5383 Other fatigue: Secondary | ICD-10-CM | POA: Diagnosis not present

## 2023-08-20 MED ORDER — ARIPIPRAZOLE 10 MG PO TABS: 10.0000 mg | ORAL_TABLET | Freq: Every day | ORAL | 3 refills | Status: AC

## 2023-08-20 MED ORDER — LAMOTRIGINE 200 MG PO TABS: 200.0000 mg | ORAL_TABLET | Freq: Every day | ORAL | 3 refills | Status: AC

## 2023-08-20 NOTE — Progress Notes (Signed)
 Mary Knox [id number] August 16, 1980 43 y.o.  Subjective:   Patient ID:  Mary Knox is a 43 y.o. (DOB Feb 10, 1981) female.  Chief Complaint:  Chief Complaint  Patient presents with   Follow-up    HPI [name] presents to the office today for follow-up of bipolarand anxiety.    visit April 2020.  She was doing well no meds were changed.  April 2021 visit with the following noted: Asked to increase Abilify to 10 and mood more stable and less depression.  No sign panic or anxiiety.  08/16/2020 appointment with the following noted: Fine and normal. No Covid. Ran out of meds for 2 days and feels a little off.   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   D going to college.   Stayed on Abilify 10 mg daily and lamotrigine 200 mg daily. Plan: Now on Abilify 10 mg per her request.  We will also continue the lamotrigine 200 mg daily for bipolar disorder. No med changes today. On these for 6 years.  08/16/2021 appointment with the following noted: Mood has been ok and stable. Stress with D at [name] and won't talk to pt.   Consistent with meds.  No SE and no TD sx. Plan: no changes  08/20/22 appt noted: Consistent with meds.  No SE Added metformin. Doing welll wihtout.  Building a house and nearly done.  No mood swings.  But 3-4 times had very short cycles.  Not severe just couldn't sleep.  Overall very stable.   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 D still doing well but still won't talk to her but does talke with other family members.     08/20/23 appt noted: Med : Abilify 10 mg , lamotrigine 200 mg daily for bipolar disorder. No SE.  Satisfied with meds.   Lost 40# since last year with Zepbound. She's satisfied with it.   Walking more. Mood been stable and normal for her.   [name] still not talking to  her but not dep over it.  Sad sometimes. Patient reports stable mood and denies depressed or irritable moods. No manic sx. Patient denies  any recent difficulty with anxiety.  Patient denies difficulty with sleep initiation or maintenance. Denies appetite disturbance.  Patient reports that energy is low but motivation have been good.  Patient denies any difficulty with concentration.  Patient denies any suicidal ideation.  PCP Peggyann Shoals.  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  Retired.    D sees TH  and won't talk to pt  Review of Systems:  Review of Systems  Cardiovascular:  Negative for palpitations.  Musculoskeletal:  Positive for arthralgias.  Neurological:  Negative for tremors.    Medications: I have reviewed the patient's current medications.  Current Outpatient Medications  Medication Sig Dispense Refill   albuterol (VENTOLIN HFA) 108 (90 Base) MCG/ACT inhaler Inhale 1-2 puffs into the lungs every 6 (six) hours as needed for wheezing. 1 each 0   benzonatate (TESSALON) 100 MG capsule Take 1 capsule (100 mg total) by mouth every 8 (eight) hours. 21 capsule 0   Cholecalciferol (VITAMIN D3) 5000 units CAPS Take 1 capsule by mouth daily.     ibuprofen (ADVIL) 200 MG tablet Take 400 mg by mouth every 8 (eight) hours as needed for moderate pain.     levothyroxine (SYNTHROID, LEVOTHROID) 50 MCG tablet Take by mouth.  metFORMIN (GLUCOPHAGE) 500 MG tablet Take 1 tablet (500 mg total) by mouth daily with breakfast. 30 tablet 0   ZEPBOUND 12.5 MG/0.5ML Pen Inject 12.5 mg into the skin once a week.     ARIPiprazole (ABILIFY) 10 MG tablet Take 1 tablet (10 mg total) by mouth daily. 90 tablet 3   lamoTRIgine (LAMICTAL) 200 MG tablet Take 1 tablet (200 mg total) by mouth daily. 90 tablet 3   No current facility-administered medications for this visit.    Medication Side Effects: None, sun sensitivity  Allergies: No Known Allergies  Past Medical History:  Diagnosis Date   Anxiety    Asthma    Asthma    B12 deficiency    Back pain     Bipolar 1 disorder (HCC)    Bipolar disorder (HCC)    Constipation    Depression    Depression    Fatty liver    Hashimoto's disease    Lactose intolerance    Overweight    PCOS (polycystic ovarian syndrome)     Family History  Problem Relation Age of Onset   Alcohol abuse Father    Bipolar disorder Father        also OCD   Obesity Father    Sleep apnea Father    Heart disease Father    High blood pressure Father    Diabetes Paternal Grandfather    Stroke Paternal Grandfather    Hypertension Other    Parkinsonism Other    Hypothyroidism Mother    Obesity Mother     Social History   Socioeconomic History   Marital status: Single    Spouse name: Not on file   Number of children: Not on file   Years of education: Not on file   Highest education level: Not on file  Occupational History   Occupation: Building control surveyor  Tobacco Use   Smoking status: Never   Smokeless tobacco: Never  Substance and Sexual Activity   Alcohol use: Yes    Comment:  rarely   Drug use: No   Sexual activity: Not on file  Other Topics Concern   Not on file  Social History Narrative   Not on file   Social Drivers of Health   Financial Resource Strain: Low Risk  (04/05/2021)   Received from [facility], [facility]   Overall Financial Resource Strain (CARDIA)    Difficulty of Paying Living Expenses: Not hard at all  Food Insecurity: No Food Insecurity (04/05/2021)   Received from [facility], [facility]   Hunger Vital Sign    Worried About Running Out of Food in the Last Year: Never true    Ran Out of Food in the Last Year: Never true  Transportation Needs: No Transportation Needs (04/05/2021)   Received from [employer], [facility]   PRAPARE - Transportation    Lack of Transportation (Medical): No    Lack of Transportation (Non-Medical): No  Physical Activity: Insufficiently Active (04/05/2021)   Received from [facility], [facility]   Exercise Vital Sign    Days of  Exercise per Week: 1 day    Minutes of Exercise per Session: 10 min  Stress: No Stress Concern Present (04/05/2021)   Received from [facility], [facility] - Occupational Stress Questionnaire    Feeling of Stress : Not at all  Social Connections: Unknown (09/11/2021)   Received from [facility], [facility]   Social Network  Social Network: Not on file  Intimate Partner Violence: Unknown (08/06/2021)   Received from [facility], [facility]   HITS    Physically Hurt: Not on file    Insult or Talk Down To: Not on file    Threaten Physical Harm: Not on file    Scream or Curse: Not on file    Past Medical History, Surgical history, Social history, and Family history were reviewed and updated as appropriate.   Please see review of systems for further details on the patient's review from today.   Objective:   Physical Exam:  There were no vitals taken for this visit.  Physical Exam Constitutional:      Appearance: She is obese.  Neurological:     Mental Status: She is alert and oriented to person, place, and time.     Cranial Nerves: No dysarthria.     Motor: No tremor.     Coordination: Coordination normal.  Psychiatric:        Attention and Perception: Attention normal.        Mood and Affect: Mood normal. Mood is not anxious or depressed.        Speech: Speech normal. Speech is not slurred.        Behavior: Behavior is not agitated. Behavior is cooperative.        Thought Content: Thought content normal. Thought content is not paranoid or delusional. Thought content does not include homicidal or suicidal ideation. Thought content does not include suicidal plan.        Cognition and Memory: Cognition and memory normal.        Judgment: Judgment normal.     Lab Review:     Component Value Date/Time   NA 138 02/16/2018 1605   K 3.9 02/16/2018 1605   CL 100 02/16/2018 1605   CO2 31 02/16/2018 1605   GLUCOSE 95  02/16/2018 1605   BUN 15 02/16/2018 1605   CREATININE 0.97 02/16/2018 1605   CALCIUM 10.0 02/16/2018 1605   PROT 6.9 02/16/2018 1605   ALBUMIN 4.3 02/16/2018 1605   AST 14 02/16/2018 1605   ALT 28 02/16/2018 1605   ALKPHOS 119 (H) 02/16/2018 1605   BILITOT 0.3 02/16/2018 1605   GFRNONAA 77.98 03/23/2009 1454       Component Value Date/Time   WBC 12.2 (H) 12/01/2019 1438   WBC 13.0 (H) 11/12/2012 1435   RBC 4.69 12/01/2019 1438   HGB 14.4 12/01/2019 1438   HGB 14.1 03/24/2018 1009   HCT 42.8 12/01/2019 1438   HCT 40.0 03/24/2018 1009   PLT 413 (H) 12/01/2019 1438   MCV 91.3 12/01/2019 1438   MCV 90 03/24/2018 1009   MCH 30.7 12/01/2019 1438   MCHC 33.6 12/01/2019 1438   RDW 12.8 12/01/2019 1438   RDW 12.8 03/24/2018 1009   LYMPHSABS 3.7 12/01/2019 1438   LYMPHSABS 3.8 (H) 03/24/2018 1009   MONOABS 1.0 12/01/2019 1438   EOSABS 0.6 (H) 12/01/2019 1438   EOSABS 0.6 (H) 03/24/2018 1009   BASOSABS 0.1 12/01/2019 1438   BASOSABS 0.1 03/24/2018 1009    No results found for: "POCLITH", "LITHIUM"   No results found for: "PHENYTOIN", "PHENOBARB", "VALPROATE", "CBMZ"   .res Assessment: Plan:    Bipolar I disorder (HCC) - Plan: ARIPiprazole (ABILIFY) 10 MG tablet, lamoTRIgine (LAMICTAL) 200 MG tablet  Fatigue, unspecified type  Anxiety state - Plan: ARIPiprazole (ABILIFY) 10 MG tablet  History of anxiety  Samayra is been stable over the last year with  the exception of an attempt to reduce Abilify from 7.5 to 5 mg.  She became manic shortly thereafter but recognized it and we increased the Abilify back back to 7.5 mg daily.   Abilify 10 mg , lamotrigine 200 mg daily for bipolar disorder.  No med changes today. On these for 10 years or so.  Discussed potential metabolic side effects associated with atypical antipsychotics, as well as potential risk for movement side effects. Advised pt to contact office if movement side effects occur. No AIM.  Disc this again.    Answered  questions about vitamins.  Disc withdrawal risks etc.  Her driving anxiety is manageable  Follow-up 1 year  Levert Ready, MD, DFAPA   Please see After Visit Summary for patient specific instructions.  No future appointments.  No orders of the defined types were placed in this encounter.     -------------------------------

## 2023-10-15 ENCOUNTER — Encounter (INDEPENDENT_AMBULATORY_CARE_PROVIDER_SITE_OTHER): Payer: Self-pay

## 2023-10-22 ENCOUNTER — Other Ambulatory Visit: Payer: Self-pay | Admitting: Internal Medicine

## 2023-10-22 DIAGNOSIS — Z1231 Encounter for screening mammogram for malignant neoplasm of breast: Secondary | ICD-10-CM

## 2023-10-31 ENCOUNTER — Encounter (INDEPENDENT_AMBULATORY_CARE_PROVIDER_SITE_OTHER): Payer: Self-pay

## 2023-12-12 ENCOUNTER — Ambulatory Visit: Payer: Self-pay

## 2024-01-07 ENCOUNTER — Telehealth: Payer: Self-pay | Admitting: Psychiatry

## 2024-01-07 NOTE — Telephone Encounter (Signed)
 Pt had a depressive episode and wants to know if she has another one what should she do. If she should up dosage. Doesn't know what triggered it, happened while she was in [place].    Next appt 08/23/24

## 2024-01-08 NOTE — Telephone Encounter (Signed)
 LVM to [facility]

## 2024-01-09 NOTE — Telephone Encounter (Signed)
 Pt reporting that this happened in July. We discussed that traveling, change in time zones, etc likely contributed to sx. She reports she has been back to normal since her return back home. Told her that there was nothing to be done at this time, but it happens again under different circumstances or is persistent we can look at a med adjustment.

## 2024-01-09 NOTE — Telephone Encounter (Signed)
 Left second VM to [facility]

## 2024-01-26 ENCOUNTER — Ambulatory Visit
Admission: RE | Admit: 2024-01-26 | Discharge: 2024-01-26 | Disposition: A | Discharge status: Home / Self Care | Payer: Self-pay | Source: Ambulatory Visit | Attending: Internal Medicine | Admitting: Internal Medicine

## 2024-01-26 DIAGNOSIS — Z1231 Encounter for screening mammogram for malignant neoplasm of breast: Secondary | ICD-10-CM

## 2024-08-23 ENCOUNTER — Ambulatory Visit: Admitting: Psychiatry
